# Patient Record
Sex: Female | Born: 1980 | Race: White | Hispanic: No | Marital: Married | Smoking: Never smoker
Health system: Southern US, Community
[De-identification: ages and names within clinical notes are randomized; demographics above are authoritative.]

## PROBLEM LIST (undated history)

## (undated) DIAGNOSIS — J45909 Unspecified asthma, uncomplicated: Secondary | ICD-10-CM

## (undated) DIAGNOSIS — T7840XA Allergy, unspecified, initial encounter: Secondary | ICD-10-CM

## (undated) DIAGNOSIS — D649 Anemia, unspecified: Secondary | ICD-10-CM

## (undated) HISTORY — DX: Allergy, unspecified, initial encounter: T78.40XA

## (undated) HISTORY — DX: Anemia, unspecified: D64.9

## (undated) HISTORY — DX: Unspecified asthma, uncomplicated: J45.909

---
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
## 2000-03-09 ENCOUNTER — Other Ambulatory Visit: Admission: RE | Admit: 2000-03-09 | Discharge: 2000-03-09 | Payer: Self-pay | Admitting: Gynecology

## 2001-05-02 ENCOUNTER — Other Ambulatory Visit: Admission: RE | Admit: 2001-05-02 | Discharge: 2001-05-02 | Payer: Self-pay | Admitting: Gynecology

## 2003-12-23 ENCOUNTER — Other Ambulatory Visit: Admission: RE | Admit: 2003-12-23 | Discharge: 2003-12-23 | Payer: Self-pay | Admitting: Obstetrics and Gynecology

## 2005-03-20 ENCOUNTER — Other Ambulatory Visit: Admission: RE | Admit: 2005-03-20 | Discharge: 2005-03-20 | Payer: Self-pay | Admitting: Obstetrics and Gynecology

## 2012-05-10 ENCOUNTER — Ambulatory Visit (INDEPENDENT_AMBULATORY_CARE_PROVIDER_SITE_OTHER): Payer: BC Managed Care – PPO | Admitting: Physician Assistant

## 2012-05-10 VITALS — BP 123/83 | HR 92 | Temp 98.6°F | Resp 16 | Ht 66.5 in | Wt 136.2 lb

## 2012-05-10 DIAGNOSIS — J302 Other seasonal allergic rhinitis: Secondary | ICD-10-CM | POA: Insufficient documentation

## 2012-05-10 DIAGNOSIS — J309 Allergic rhinitis, unspecified: Secondary | ICD-10-CM

## 2012-05-10 MED ORDER — FLUTICASONE PROPIONATE 50 MCG/ACT NA SUSP: 2.0000 | Freq: Every day | NASAL | Status: DC

## 2012-05-10 NOTE — Progress Notes (Signed)
  Subjective:    Patient ID: KIMBERL VIG, female    DOB: 06/05/1981, 31 y.o.   MRN: [id number]  HPI 31 yr old CF presents with 2-3 day history of drainage in throat, slight sore throat, R ear pressure, and her voice is scratchy.  She felt achy last night.  She just returned from a beach trip and is travelling to [place] tomorrow where she is supposed to be singing on Saturday and Sunday. No f/c. She has a history of year-round allergies.  Nasal sprays haven't worked great in the past for her.   Review of Systems  All other systems reviewed and are negative.      Objective:   Physical Exam  Nursing note and vitals reviewed. Constitutional: She is oriented to person, place, and time. She appears well-developed and well-nourished.  HENT:  Head: Normocephalic and atraumatic.  Right Ear: External ear normal.  Left Ear: External ear normal.  Mouth/Throat: Oropharynx is clear and moist. No oropharyngeal exudate (throat w/ PND.  no swelling of tonsils or uvula).       R TM bulging with fluid.  Turbinates enlarged and more erythematous than allergic-looking.  Neck: Normal range of motion. Neck supple.  Cardiovascular: Normal rate, regular rhythm and normal heart sounds.   Pulmonary/Chest: Effort normal and breath sounds normal.  Lymphadenopathy:    She has no cervical adenopathy.  Neurological: She is alert and oriented to person, place, and time.  Skin: Skin is warm and dry.  Psychiatric: She has a normal mood and affect. Her behavior is normal.   Voice is scratchy, but not hoarse.    Assessment & Plan:  URI affecting voice-patient inquired about a steroid shot, but I really don't think that will help because there isn't significant swelling; it appears more related to the mucosa being raw from drainage. Salt water gargles. Tea with lemon and honey.  REST voice!! Add mucinex D.

## 2012-07-25 ENCOUNTER — Ambulatory Visit (INDEPENDENT_AMBULATORY_CARE_PROVIDER_SITE_OTHER): Payer: BC Managed Care – PPO | Admitting: Family Medicine

## 2012-07-25 ENCOUNTER — Ambulatory Visit: Payer: BC Managed Care – PPO

## 2012-07-25 VITALS — BP 127/86 | HR 92 | Temp 98.7°F | Resp 16 | Ht 66.5 in | Wt 138.0 lb

## 2012-07-25 DIAGNOSIS — R1013 Epigastric pain: Secondary | ICD-10-CM

## 2012-07-25 LAB — POCT URINALYSIS DIPSTICK
Bilirubin, UA: NEGATIVE
Glucose, UA: NEGATIVE
Ketones, UA: NEGATIVE
Nitrite, UA: NEGATIVE
Protein, UA: NEGATIVE
Spec Grav, UA: 1.02
Urobilinogen, UA: 0.2
pH, UA: 6

## 2012-07-25 LAB — POCT CBC
Granulocyte percent: 61.3 %G (ref 37–80)
HCT, POC: 44.1 % (ref 37.7–47.9)
Hemoglobin: 13.7 g/dL (ref 12.2–16.2)
Lymph, poc: 3.3 (ref 0.6–3.4)
MCH, POC: 30.1 pg (ref 27–31.2)
MCHC: 31.1 g/dL — AB (ref 31.8–35.4)
MCV: 96.9 fL (ref 80–97)
MID (cbc): 0.5 (ref 0–0.9)
MPV: 9.4 fL (ref 0–99.8)
POC Granulocyte: 6 (ref 2–6.9)
POC LYMPH PERCENT: 33.8 %L (ref 10–50)
POC MID %: 4.9 %M (ref 0–12)
Platelet Count, POC: 382 10*3/uL (ref 142–424)
RBC: 4.55 M/uL (ref 4.04–5.48)
RDW, POC: 12.5 %
WBC: 9.8 10*3/uL (ref 4.6–10.2)

## 2012-07-25 MED ORDER — DICYCLOMINE HCL 10 MG PO CAPS: 10.0000 mg | ORAL_CAPSULE | Freq: Three times a day (TID) | ORAL | Status: DC

## 2012-07-25 MED ORDER — ESOMEPRAZOLE MAGNESIUM 40 MG PO CPDR: 40.0000 mg | DELAYED_RELEASE_CAPSULE | Freq: Every day | ORAL | Status: DC

## 2012-07-25 NOTE — Patient Instructions (Signed)
Clear liquids tonight.  Eggs and soft foods tomorrow.  Return if symptoms persist

## 2012-07-25 NOTE — Progress Notes (Signed)
31 yo woman with 3 weeks of epigastric abdominal discomfort until today when she developed more intense, sharp cramps.  She had BM x 2 and pain lessened.  The pain began right  After lunch. LMP:  October 12-15 Some nausea today. On loestrin  Objective:  NAD Thin white woman  HEENT:  No jaundice Chest:  Clear Heart: reg, no  Murmur Abdomen:  No significant tenderness, no HSM, no masses, active BS Extrem: normal movement, inspection  [facility] reading (PRIMARY) by  Dr. Milus Glazier.  KUB-normal  Results for orders placed in visit on 07/25/12  POCT CBC      Component Value Range   WBC 9.8  4.6 - 10.2 K/uL   Lymph, poc 3.3  0.6 - 3.4   POC LYMPH PERCENT 33.8  10 - 50 %L   MID (cbc) 0.5  0 - 0.9   POC MID % 4.9  0 - 12 %M   POC Granulocyte 6.0  2 - 6.9   Granulocyte percent 61.3  37 - 80 %G   RBC 4.55  4.04 - 5.48 M/uL   Hemoglobin 13.7  12.2 - 16.2 g/dL   HCT, POC 16.1  09.6 - 47.9 %   MCV 96.9  80 - 97 fL   MCH, POC 30.1  27 - 31.2 pg   MCHC 31.1 (*) 31.8 - 35.4 g/dL   RDW, POC 04.5     Platelet Count, POC 382  142 - 424 K/uL   MPV 9.4  0 - 99.8 fL  POCT URINALYSIS DIPSTICK      Component Value Range   Color, UA yellow     Clarity, UA clear     Glucose, UA neg     Bilirubin, UA neg     Ketones, UA neg     Spec Grav, UA 1.020     Blood, UA mod     pH, UA 6.0     Protein, UA neg     Urobilinogen, UA 0.2     Nitrite, UA neg     Leukocytes, UA Trace     Assessment:  Acute crampy abdominal pain, likely gastritis  Plan:

## 2013-01-05 ENCOUNTER — Ambulatory Visit (INDEPENDENT_AMBULATORY_CARE_PROVIDER_SITE_OTHER): Payer: BC Managed Care – PPO | Admitting: Family Medicine

## 2013-01-05 VITALS — BP 133/77 | HR 99 | Temp 98.1°F | Resp 18 | Ht 66.5 in | Wt 138.6 lb

## 2013-01-05 DIAGNOSIS — J029 Acute pharyngitis, unspecified: Secondary | ICD-10-CM

## 2013-01-05 DIAGNOSIS — R05 Cough: Secondary | ICD-10-CM

## 2013-01-05 DIAGNOSIS — J0100 Acute maxillary sinusitis, unspecified: Secondary | ICD-10-CM

## 2013-01-05 DIAGNOSIS — R059 Cough, unspecified: Secondary | ICD-10-CM

## 2013-01-05 MED ORDER — FLUTICASONE PROPIONATE 50 MCG/ACT NA SUSP: 2.0000 | Freq: Every day | NASAL | Status: AC

## 2013-01-05 MED ORDER — CEFDINIR 300 MG PO CAPS: 600.0000 mg | ORAL_CAPSULE | Freq: Every day | ORAL | Status: DC

## 2013-01-05 MED ORDER — HYDROCOD POLST-CHLORPHEN POLST 10-8 MG/5ML PO LQCR: 5.0000 mL | Freq: Two times a day (BID) | ORAL | Status: DC | PRN

## 2013-01-05 NOTE — Progress Notes (Signed)
[address]   [place], [place]  [address]   [phone]  Subjective:    Patient ID: Haley Curry, female    DOB: 03-28-1981, 32 y.o.   MRN: [id number]  HPI This 32 y.o. female presents for evaluation of sinus congestion.  Husband recently ill with viral URI; onset of severe sore throat.  The following day, developed severe rhinorrhea, sinus pressure, mucous thickening during first week. Week two, suffered with cough.  Feeling better with persistent cough, thick drainage.  Felt great two days ago.  Awoke this morning, woke up with sore throat.  Husband experienced the same thing with recurrent sore throat. No fever recently but initially.  Severe headache four days ago but did not take Sudafed that day; had sinus pressure.  ST persistent; unable to sleep last night due to sore throat; pain with swallowing.  R ear pain started last night.  No longer has rhinorrhea but having PND; yellow thick but now clear.  Coughing is some better; still coughing.  No n/v/d.  Mucinex DM, Benadryl PRN, Zyrtec. Stopped Flonase.  Sinus pressure improved today.  Works in [employer]; husband works at [employer].  No tobacco.  Worried about whooping cough; due for Tetanus vaccine.  Cough is dry; no sputum production; no SOB.   Review of Systems  Constitutional: Negative for fever, chills, diaphoresis and fatigue.  HENT: Positive for ear pain, congestion, sore throat, rhinorrhea, trouble swallowing, voice change, postnasal drip and sinus pressure.   Respiratory: Positive for cough. Negative for shortness of breath, wheezing and stridor.   Gastrointestinal: Negative for nausea, vomiting and diarrhea.  Neurological: Positive for headaches.        Past Medical History  Diagnosis Date  . Allergy   . Anemia     History reviewed. No pertinent past surgical history.  Prior to Admission medications   Medication Sig Start Date End Date Taking? Authorizing Provider  cetirizine (ZYRTEC) 10 MG tablet Take 10 mg by  mouth daily.   Yes Historical Provider, MD  fluticasone (FLONASE) 50 MCG/ACT nasal spray Place 2 sprays into the nose daily. 05/10/12 05/10/13 Yes Marzella Schlein McClung, PA-C  mineral oil external liquid by Does not apply route.   Yes Historical Provider, MD  norethindrone-ethinyl estradiol (MICROGESTIN,JUNEL,LOESTRIN) 1-20 MG-MCG tablet Take 1 tablet by mouth daily.   Yes Historical Provider, MD  dicyclomine (BENTYL) 10 MG capsule Take 1 capsule (10 mg total) by mouth 4 (four) times daily -  before meals and at bedtime. 07/25/12   Elvina Sidle, MD  esomeprazole (NEXIUM) 40 MG capsule Take 1 capsule (40 mg total) by mouth daily. 07/25/12   Elvina Sidle, MD  Norethindrone Acetate-Ethinyl Estrad-FE (LOESTRIN 24 FE) 1-20 MG-MCG(24) tablet Take 1 tablet by mouth daily.    Historical Provider, MD    No Known Allergies  History   Social History  . Marital Status: Married    Spouse Name: N/A    Number of Children: N/A  . Years of Education: N/A   Occupational History  . Not on file.   Social History Main Topics  . Smoking status: Never Smoker   . Smokeless tobacco: Never Used  . Alcohol Use: Yes     Comment: social  . Drug Use: No  . Sexually Active: Not on file   Other Topics Concern  . Not on file   Social History Narrative  . No narrative on file    No family history on file.  Objective:   Physical Exam  Nursing note and vitals reviewed. Constitutional: She is oriented to person, place, and time. She appears well-developed and well-nourished. No distress.  HENT:  Head: Normocephalic and atraumatic.  Right Ear: External ear normal.  Left Ear: External ear normal.  Nose: Mucosal edema and rhinorrhea present.  Mouth/Throat: Mucous membranes are normal. Posterior oropharyngeal erythema present. No oropharyngeal exudate, posterior oropharyngeal edema or tonsillar abscesses.  Neck: Normal range of motion. Neck supple. No thyromegaly present.  Cardiovascular: Normal rate, regular  rhythm and normal heart sounds.   No murmur heard. Pulmonary/Chest: Effort normal and breath sounds normal. No respiratory distress. She has no wheezes. She has no rales.  Lymphadenopathy:    She has no cervical adenopathy.  Neurological: She is alert and oriented to person, place, and time.  Skin: Skin is warm and dry. No rash noted. She is not diaphoretic.  Psychiatric: She has a normal mood and affect. Her behavior is normal.    Results for orders placed in visit on 01/05/13  POCT RAPID STREP A (OFFICE)      Result Value Range   Rapid Strep A Screen Negative  Negative       Assessment & Plan:  Acute pharyngitis - Plan: POCT rapid strep A, Culture, Group A Strep  Cough - Plan: POCT rapid strep A, Culture, Group A Strep, chlorpheniramine-HYDROcodone (TUSSIONEX PENNKINETIC ER) 10-8 MG/5ML LQCR  Sinusitis, acute maxillary - Plan: cefdinir (OMNICEF) 300 MG capsule, fluticasone (FLONASE) 50 MCG/ACT nasal spray    1.  Acute Pharyngitis:  New.  Rapid strep negative; send throat culture. Treat supportively with rest, fluids, salt water gargles, Ibuprofen or Tylenol. 2.  Acute Sinusitis Maxillary:  New.  Rx for Omnicef.  Continue Sudafed, Zyrtec, Mucinex DM.  Restart Flonase. 3.  Cough:  New. Secondary to PND; rx for Tussionex to use PRN.  Meds ordered this encounter  Medications  . mineral oil external liquid    Sig: by Does not apply route.  . norethindrone-ethinyl estradiol (MICROGESTIN,JUNEL,LOESTRIN) 1-20 MG-MCG tablet    Sig: Take 1 tablet by mouth daily.  . cefdinir (OMNICEF) 300 MG capsule    Sig: Take 2 capsules (600 mg total) by mouth daily.    Dispense:  20 capsule    Refill:  0  . chlorpheniramine-HYDROcodone (TUSSIONEX PENNKINETIC ER) 10-8 MG/5ML LQCR    Sig: Take 5 mLs by mouth every 12 (twelve) hours as needed.    Dispense:  240 mL    Refill:  0  . fluticasone (FLONASE) 50 MCG/ACT nasal spray    Sig: Place 2 sprays into the nose daily.    Dispense:  16 g     Refill:  11

## 2013-01-05 NOTE — Patient Instructions (Addendum)
Acute pharyngitis - Plan: POCT rapid strep A, Culture, Group A Strep  Cough - Plan: POCT rapid strep A, Culture, Group A Strep, chlorpheniramine-HYDROcodone (TUSSIONEX PENNKINETIC ER) 10-8 MG/5ML LQCR  Sinusitis, acute maxillary - Plan: cefdinir (OMNICEF) 300 MG capsule, fluticasone (FLONASE) 50 MCG/ACT nasal spray

## 2013-01-07 LAB — CULTURE, GROUP A STREP

## 2013-01-15 ENCOUNTER — Telehealth: Payer: Self-pay

## 2013-01-15 NOTE — Telephone Encounter (Signed)
Spoke to her, she states she is having GI upset. She states she discontinued the medication, then tried taking it again. She is taking with food. She is advised with antibiotics, it is best to finish the entire course. She has 3 days left. She states she can not tolerate it, please advise. She states her illness has resolved.

## 2013-01-15 NOTE — Telephone Encounter (Signed)
PATIENT STATES SHE IS HAVING AN ALLERGIC REACTION TO OMNICEF. SHE USES [facility] ON [address]. [phone].

## 2013-01-16 NOTE — Telephone Encounter (Signed)
Called her to advise. She is better today.

## 2013-01-16 NOTE — Telephone Encounter (Signed)
OK to stop antibiotic. Follow up if symptoms persist.

## 2013-01-25 ENCOUNTER — Ambulatory Visit (INDEPENDENT_AMBULATORY_CARE_PROVIDER_SITE_OTHER): Payer: BC Managed Care – PPO | Admitting: Emergency Medicine

## 2013-01-25 VITALS — BP 122/78 | HR 88 | Temp 99.8°F | Resp 18 | Wt 143.0 lb

## 2013-01-25 DIAGNOSIS — J309 Allergic rhinitis, unspecified: Secondary | ICD-10-CM

## 2013-01-25 DIAGNOSIS — H9209 Otalgia, unspecified ear: Secondary | ICD-10-CM

## 2013-01-25 DIAGNOSIS — R509 Fever, unspecified: Secondary | ICD-10-CM

## 2013-01-25 DIAGNOSIS — J029 Acute pharyngitis, unspecified: Secondary | ICD-10-CM

## 2013-01-25 DIAGNOSIS — H9202 Otalgia, left ear: Secondary | ICD-10-CM

## 2013-01-25 LAB — POCT CBC
Granulocyte percent: 72.5 %G (ref 37–80)
HCT, POC: 43.6 % (ref 37.7–47.9)
Lymph, poc: 2.2 (ref 0.6–3.4)
MCV: 95.6 fL (ref 80–97)
MPV: 9.3 fL (ref 0–99.8)
POC LYMPH PERCENT: 21.4 %L (ref 10–50)
Platelet Count, POC: 301 10*3/uL (ref 142–424)
RDW, POC: 13.1 %
WBC: 10.5 10*3/uL — AB (ref 4.6–10.2)

## 2013-01-25 LAB — POCT RAPID STREP A (OFFICE): Rapid Strep A Screen: NEGATIVE

## 2013-01-25 MED ORDER — PREDNISONE 10 MG PO TABS: ORAL_TABLET | ORAL | Status: DC

## 2013-01-25 MED ORDER — AZITHROMYCIN 250 MG PO TABS: ORAL_TABLET | ORAL | Status: DC

## 2013-01-25 NOTE — Progress Notes (Signed)
  Subjective:    Patient ID: Haley Curry, female    DOB: December 10, 1980, 32 y.o.   MRN: [id number]  HPI 32 year old female presents with left otalgia and sore throat since Thursday night. She states it has lots of pressure with popping and "squealing" sounds. Has had fever and chills x last night. Denies night sweats. Had strep in the past years ago. LMP: 1 month ago.  Review of Systems     Objective:   Physical Exam patient is alert cooperative does not appear ill. The TMs are both normal. A small redness on the left side of the throat. There is no adenopathy. Chest is clear to auscultation and percussion.  Results for orders placed in visit on 01/25/13  POCT RAPID STREP A (OFFICE)      Result Value Range   Rapid Strep A Screen Negative  Negative   Results for orders placed in visit on 01/25/13  POCT RAPID STREP A (OFFICE)      Result Value Range   Rapid Strep A Screen Negative  Negative  POCT CBC      Result Value Range   WBC 10.5 (*) 4.6 - 10.2 K/uL   Lymph, poc 2.2  0.6 - 3.4   POC LYMPH PERCENT 21.4  10 - 50 %L   MID (cbc) 0.6  0 - 0.9   POC MID % 6.1  0 - 12 %M   POC Granulocyte 7.6 (*) 2 - 6.9   Granulocyte percent 72.5  37 - 80 %G   RBC 4.56  4.04 - 5.48 M/uL   Hemoglobin 13.7  12.2 - 16.2 g/dL   HCT, POC 40.9  81.1 - 47.9 %   MCV 95.6  80 - 97 fL   MCH, POC 30.0  27 - 31.2 pg   MCHC 31.4 (*) 31.8 - 35.4 g/dL   RDW, POC 91.4     Platelet Count, POC 301  142 - 424 K/uL   MPV 9.3  0 - 99.8 fL        Assessment & Plan:  Strep culture was done. The white count is elevated. She's had trouble in the distant past with chronic sinus infections We  will treat with a Z-Pak and prednisone  Taper. she was advised about birth control backup .

## 2013-01-27 LAB — CULTURE, GROUP A STREP: Organism ID, Bacteria: NORMAL

## 2013-02-15 ENCOUNTER — Ambulatory Visit (INDEPENDENT_AMBULATORY_CARE_PROVIDER_SITE_OTHER): Payer: BC Managed Care – PPO | Admitting: Family Medicine

## 2013-02-15 VITALS — BP 88/70 | HR 77 | Temp 98.6°F | Resp 18 | Wt 142.0 lb

## 2013-02-15 DIAGNOSIS — K529 Noninfective gastroenteritis and colitis, unspecified: Secondary | ICD-10-CM

## 2013-02-15 DIAGNOSIS — K5289 Other specified noninfective gastroenteritis and colitis: Secondary | ICD-10-CM

## 2013-02-15 MED ORDER — METRONIDAZOLE 250 MG PO TABS: 250.0000 mg | ORAL_TABLET | Freq: Three times a day (TID) | ORAL | Status: DC

## 2013-02-15 NOTE — Progress Notes (Signed)
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  She didn't sleep that night and subsequently develops the abdominal discomfort and bloating with food consumption.  Able to drink water okay.  Nauseated, but no vomiting, or diarrhea.  No significant belching.  Objective:  NAD Abdomen:  Mildly distended.  Hyperactive BS.  Assessment:  Gastroenteritis  Plan:  Probiotics Flagyl  Signed, Elvina Sidle, MD

## 2013-04-23 ENCOUNTER — Ambulatory Visit (INDEPENDENT_AMBULATORY_CARE_PROVIDER_SITE_OTHER): Payer: BC Managed Care – PPO | Admitting: Family Medicine

## 2013-04-23 VITALS — BP 120/70 | HR 64 | Temp 98.2°F | Resp 16 | Ht 67.0 in | Wt 145.0 lb

## 2013-04-23 DIAGNOSIS — J45990 Exercise induced bronchospasm: Secondary | ICD-10-CM

## 2013-04-23 DIAGNOSIS — R591 Generalized enlarged lymph nodes: Secondary | ICD-10-CM

## 2013-04-23 DIAGNOSIS — J3489 Other specified disorders of nose and nasal sinuses: Secondary | ICD-10-CM

## 2013-04-23 DIAGNOSIS — R599 Enlarged lymph nodes, unspecified: Secondary | ICD-10-CM

## 2013-04-23 MED ORDER — MUPIROCIN 2 % EX OINT: TOPICAL_OINTMENT | Freq: Three times a day (TID) | CUTANEOUS | Status: DC

## 2013-04-23 MED ORDER — DOXYCYCLINE HYCLATE 100 MG PO TABS: 100.0000 mg | ORAL_TABLET | Freq: Two times a day (BID) | ORAL | Status: DC

## 2013-04-23 MED ORDER — ALBUTEROL SULFATE HFA 108 (90 BASE) MCG/ACT IN AERS: 2.0000 | INHALATION_SPRAY | Freq: Four times a day (QID) | RESPIRATORY_TRACT | Status: DC | PRN

## 2013-04-23 NOTE — Progress Notes (Signed)
32 year old married woman who works at [employer]. She comes in with several problems. First of all she's noticed a sore bump behind her right ear which has been associated with some scalp irritation for the past month. The exam table the last several days.  Patient also has some shortness of breath and tightness in her chest when she does aerobic exercise. He's to start her new program. He's been using her husband's albuterol inhaler which has helped her.  Finally patient has noted over several years and intermittent crusting of her left nostril on the outside which is associated with some bleeding. She's been on allergy shots in the past and has an ENT doctor who she can she can consult.  Objective: No acute distress Examination of the right ear is normal. The auricle is normal. He does have some excoriation of the scalp on the right side and she does have a slightly swollen and tender right postauricular lymph node.  Examination the nose reveals some bloody crusting debris on the lateral nasal passage near the anterior aspect.  Lungs are clear  Assessment:Exercise-induced asthma - Plan: albuterol (PROVENTIL HFA;VENTOLIN HFA) 108 (90 BASE) MCG/ACT inhaler  Lymphadenopathy - Plan: doxycycline (VIBRA-TABS) 100 MG tablet, DISCONTINUED: doxycycline (VIBRA-TABS) 100 MG tablet  Nasal sore - Plan: mupirocin ointment (BACTROBAN) 2 %  Signed, Elvina Sidle, MD

## 2013-09-24 ENCOUNTER — Other Ambulatory Visit: Payer: Self-pay | Admitting: Family Medicine

## 2013-12-14 ENCOUNTER — Ambulatory Visit: Payer: BC Managed Care – PPO | Admitting: Family Medicine

## 2013-12-14 ENCOUNTER — Ambulatory Visit: Payer: BC Managed Care – PPO

## 2013-12-14 ENCOUNTER — Other Ambulatory Visit: Payer: Self-pay | Admitting: Family Medicine

## 2013-12-14 VITALS — BP 118/70 | HR 116 | Temp 98.8°F | Resp 20 | Ht 68.0 in | Wt 147.0 lb

## 2013-12-14 DIAGNOSIS — J029 Acute pharyngitis, unspecified: Secondary | ICD-10-CM

## 2013-12-14 DIAGNOSIS — M25532 Pain in left wrist: Secondary | ICD-10-CM

## 2013-12-14 DIAGNOSIS — M25531 Pain in right wrist: Secondary | ICD-10-CM

## 2013-12-14 DIAGNOSIS — M654 Radial styloid tenosynovitis [de Quervain]: Secondary | ICD-10-CM

## 2013-12-14 DIAGNOSIS — M25539 Pain in unspecified wrist: Secondary | ICD-10-CM

## 2013-12-14 DIAGNOSIS — Z3200 Encounter for pregnancy test, result unknown: Secondary | ICD-10-CM

## 2013-12-14 LAB — POCT URINE PREGNANCY: Preg Test, Ur: NEGATIVE

## 2013-12-14 LAB — POCT RAPID STREP A (OFFICE): Rapid Strep A Screen: NEGATIVE

## 2013-12-14 NOTE — Patient Instructions (Signed)
De Quervain's Disease De Quervain's disease is a condition often seen in racquet sports where there is a soreness (inflammation) in the cord like structures (tendons) which attach muscle to bone on the thumb side of the wrist. There may be a tightening of the tissuesaround the tendons. This condition is often helped by giving up or modifying the activity which caused it. When conservative treatment does not help, surgery may be required. Conservative treatment could include changes in the activity which brought about the problem or made it worse. Anti-inflammatory medications and injections may be used to help decrease the inflammation and help with pain control. Your caregiver will help you determine which is best for you. DIAGNOSIS  Often the diagnosis (learning what is wrong) can be made by examination. Sometimes x-rays are required. HOME CARE INSTRUCTIONS   Apply ice to the sore area for 15-20 minutes, 03-04 times per day while awake. Put the ice in a plastic bag and place a towel between the bag of ice and your skin. This is especially helpful if it can be done after all activities involving the sore wrist.  Temporary splinting may help.  Only take over-the-counter or prescription medicines for pain, discomfort or fever as directed by your caregiver. SEEK MEDICAL CARE IF:   Pain relief is not obtained with medications, or if you have increasing pain and seem to be getting worse rather than better. MAKE SURE YOU:   Understand these instructions.  Will watch your condition.  Will get help right away if you are not doing well or get worse. Document Released: 06/20/2001 Document Revised: 12/18/2011 Document Reviewed: 09/25/2005 [employer] Patient Information 2014 [employer].  

## 2013-12-14 NOTE — Progress Notes (Addendum)
33 year old married woman who works at [employer]. She does a lot of traveling with a light peach week the different parts the country.  Patient has 2 problems today: Left wrist pain which she developed after lifting a mattress 6 weeks ago and is located over the radial aspect of the wrist joint. It's worse when she pushes off with her hand or if she taps over the extensor tendon of the thumb and the wrist joint area.  Patient also has a sore throat for 2 days which is associated with white patches on her right tonsillar area. She's reluctant take antibiotics with sneezing and some a temp last year.  She had some myalgias initially, but has had no known fever.  Objective: No acute distress HEENT: Unremarkable except for the moderate swelling and redness of the right tonsillar pillar Neck: Supple no adenopathy Examination of the right wrist reveals tenderness in the joint line along the radial aspect but full range of motion is present. Is no bony abnormality. She has a positive Finkelstein's test Right wrist: Injected with .5 cc of Depo-Medrol and 1 cc of Marcaine without complication. Patient had a significant relief from her pain [facility] reading (PRIMARY) by  Dr. Milus GlazierLauenstein: normal right wrist Results for orders placed in visit on 12/14/13  POCT RAPID STREP A (OFFICE)      Result Value Ref Range   Rapid Strep A Screen Negative  Negative  POCT URINE PREGNANCY      Result Value Ref Range   Preg Test, Ur Negative       Assessment:  Dequervain's synovitis, pharyngitis . Sore throat - Plan: POCT rapid strep A  Possible pregnancy - Plan: POCT urine pregnancy  De Quervain's disease (radial styloid tenosynovitis)  Right wrist pain  Signed, Elvina SidleKurt Leyland Kenna, MD    Signed, Elvina SidleKurt Nylen Creque, MD

## 2014-07-19 ENCOUNTER — Ambulatory Visit (INDEPENDENT_AMBULATORY_CARE_PROVIDER_SITE_OTHER): Payer: BC Managed Care – PPO | Admitting: Internal Medicine

## 2014-07-19 VITALS — BP 120/78 | HR 86 | Temp 98.3°F | Resp 18 | Ht 66.75 in | Wt 146.8 lb

## 2014-07-19 DIAGNOSIS — J0101 Acute recurrent maxillary sinusitis: Secondary | ICD-10-CM

## 2014-07-19 MED ORDER — AMOXICILLIN 500 MG PO CAPS: 1000.0000 mg | ORAL_CAPSULE | Freq: Two times a day (BID) | ORAL | Status: AC

## 2014-07-19 NOTE — Progress Notes (Signed)
   Subjective:    Patient ID: Haley Curry, female    DOB: 03/01/1981, 33 y.o.   MRN: [id number]  HPI complaining of cold symptoms with runny nose and cough for 7 days suddenly worsening yesterday with increased sinus pressure and purulent discharge. Has a history of recurrent sinus infections most notably fall of 2014 which require prolonged antibiotic therapy by ENT before clearing. Has a responsive to nasal steroids that causes chronic sore throat so these are avoided. Is on Singulair and antihistamines--year-round allergies. Thinks she had fever last night Cough nonproductive    Review of Systems    noncontributory Objective:   Physical Exam BP 120/78  Pulse 86  Temp(Src) 98.3 F (36.8 C) (Oral)  Resp 18  Ht 5' 6.75" (1.695 m)  Wt 146 lb 12.8 oz (66.588 kg)  BMI 23.18 kg/m2  SpO2 100% TMs clear Conjunctiva slightly injected Nares with purulent mucus Pressure pain with bending over Throat clear No nodes Chest clear       Assessment & Plan:  Acute recurrent maxillary sinusitis  Meds ordered this encounter  Medications  . amoxicillin (AMOXIL) 500 MG capsule    Sig: Take 2 capsules (1,000 mg total) by mouth 2 (two) times daily.    Dispense:  40 capsule    Refill:  0  Afrin before flying Sudafed in am

## 2014-10-01 ENCOUNTER — Ambulatory Visit (INDEPENDENT_AMBULATORY_CARE_PROVIDER_SITE_OTHER): Payer: BC Managed Care – PPO | Admitting: Family Medicine

## 2014-10-01 VITALS — BP 124/80 | HR 90 | Temp 98.4°F | Resp 17 | Ht 68.0 in | Wt 144.0 lb

## 2014-10-01 DIAGNOSIS — J3489 Other specified disorders of nose and nasal sinuses: Secondary | ICD-10-CM

## 2014-10-01 DIAGNOSIS — J029 Acute pharyngitis, unspecified: Secondary | ICD-10-CM

## 2014-10-01 DIAGNOSIS — R05 Cough: Secondary | ICD-10-CM

## 2014-10-01 DIAGNOSIS — J0100 Acute maxillary sinusitis, unspecified: Secondary | ICD-10-CM

## 2014-10-01 DIAGNOSIS — R059 Cough, unspecified: Secondary | ICD-10-CM

## 2014-10-01 DIAGNOSIS — R0982 Postnasal drip: Secondary | ICD-10-CM

## 2014-10-01 LAB — POCT RAPID STREP A (OFFICE): Rapid Strep A Screen: NEGATIVE

## 2014-10-01 MED ORDER — METHYLPREDNISOLONE (PAK) 4 MG PO TABS: ORAL_TABLET | ORAL | Status: DC

## 2014-10-01 MED ORDER — AZITHROMYCIN 250 MG PO TABS: ORAL_TABLET | ORAL | Status: DC

## 2014-10-01 MED ORDER — BENZONATATE 100 MG PO CAPS: 200.0000 mg | ORAL_CAPSULE | Freq: Two times a day (BID) | ORAL | Status: DC | PRN

## 2014-10-01 MED ORDER — IPRATROPIUM BROMIDE 0.03 % NA SOLN: 2.0000 | Freq: Two times a day (BID) | NASAL | Status: DC

## 2014-10-01 NOTE — Progress Notes (Signed)
 Chief Complaint:  Chief Complaint  Patient presents with  . Sinusitis  . Sore Throat  . Nasal Congestion    HPI: Haley Curry is a 33 y.o. female who is here for sore throat and post nasal drip and sinus congestion and sore throat for last 1 week with worsening sxs in last 2 days.  She has asthma and allergies. She has a hxof allergies and asthma. No wheezing, no SOB or CP. Has tried toc meds without relief. Has not had asthma in many years.   Past Medical History  Diagnosis Date  . Allergy   . Anemia   . Asthma    No past surgical history on file. History   Social History  . Marital Status: Married    Spouse Name: N/A    Number of Children: N/A  . Years of Education: N/A   Social History Main Topics  . Smoking status: Never Smoker   . Smokeless tobacco: Never Used  . Alcohol Use: Yes     Comment: social  . Drug Use: No  . Sexual Activity: Yes    Birth Control/ Protection: Pill   Other Topics Concern  . Not on file   Social History Narrative  . No narrative on file   Family History  Problem Relation Age of Onset  . Cancer Mother   . Diabetes Father   . Asthma Brother    Allergies  Allergen Reactions  . Omnicef [Cefdinir]    Prior to Admission medications   Medication Sig Start Date End Date Taking? Authorizing Provider  albuterol (PROVENTIL HFA;VENTOLIN HFA) 108 (90 BASE) MCG/ACT inhaler Inhale 2 puffs into the lungs every 6 (six) hours as needed for wheezing. 04/23/13  Yes Elvina SidleKurt Lauenstein, MD  cetirizine (ZYRTEC) 10 MG tablet Take 10 mg by mouth daily.   Yes Historical Provider, MD  diphenhydrAMINE (BENADRYL) 25 mg capsule Take 25 mg by mouth every 6 (six) hours as needed.   Yes Historical Provider, MD  ibuprofen (ADVIL,MOTRIN) 200 MG tablet Take 400 mg by mouth every 6 (six) hours as needed.   Yes Historical Provider, MD  montelukast (SINGULAIR) 10 MG tablet Take 10 mg by mouth at bedtime.   Yes Historical Provider, MD  Norethindrone  Acetate-Ethinyl Estrad-FE (LOESTRIN 24 FE) 1-20 MG-MCG(24) tablet Take 1 tablet by mouth daily.   Yes Historical Provider, MD  omeprazole (PRILOSEC) 40 MG capsule Take 40 mg by mouth daily.   Yes Historical Provider, MD  pseudoephedrine (SUDAFED) 120 MG 12 hr tablet Take 120 mg by mouth 2 (two) times daily.   Yes Historical Provider, MD  pseudoephedrine-guaifenesin (MUCINEX D) 60-600 MG per tablet Take 1 tablet by mouth every 12 (twelve) hours.   Yes Historical Provider, MD     ROS: The patient denies fevers, chills, night sweats, unintentional weight loss, chest pain, palpitations, wheezing, dyspnea on exertion, nausea, vomiting, abdominal pain, dysuria, hematuria, melena, numbness, weakness, or tingling.   All other systems have been reviewed and were otherwise negative with the exception of those mentioned in the HPI and as above.    PHYSICAL EXAM: Filed Vitals:   10/01/14 0826  BP: 124/80  Pulse: 90  Temp: 98.4 F (36.9 C)  Resp: 17   Filed Vitals:   10/01/14 0826  Height: 5\' 8"  (1.727 m)  Weight: 144 lb (65.318 kg)   Body mass index is 21.9 kg/(m^2).  SpO2 Readings from Last 3 Encounters:  10/01/14 100%  07/19/14 100%  12/14/13 99%  General: Alert, no acute distress HEENT:  Normocephalic, atraumatic, oropharynx patent. EOMI, PERRLA. + sinus tendesnss maxillar, TM nl Cardiovascular:  Regular rate and rhythm, no rubs murmurs or gallops.  No Carotid bruits, radial pulse intact. No pedal edema.  Respiratory: Clear to auscultation bilaterally.  No wheezes, rales, or rhonchi.  No cyanosis, no use of accessory musculature GI: No organomegaly, abdomen is soft and non-tender, positive bowel sounds.  No masses. Skin: No rashes. Neurologic: Facial musculature symmetric. Psychiatric: Patient is appropriate throughout our interaction. Lymphatic: No cervical lymphadenopathy Musculoskeletal: Gait intact.   LABS: Results for orders placed or performed in visit on 10/01/14  POCT  rapid strep A  Result Value Ref Range   Rapid Strep A Screen Negative Negative     EKG/XRAY:   [facility]   ASSESSMENT/PLAN: Encounter Diagnoses  Name Primary?  . Acute maxillary sinusitis, recurrence not specified Yes  . Cough   . Sore throat   . Rhinorrhea   . PND (post-nasal drip)    Rx z pack, tessalon perles c/w antihistamine and steroid nasal spray otc IF no improvement then may take medrol dose pack.  F/u prn   Gross sideeffects, risk and benefits, and alternatives of medications d/w patient. Patient is aware that all medications have potential sideeffects and we are unable to predict every sideeffect or drug-drug interaction that may occur.  ,  PHUONG, DO 10/06/2014 1:03 PM

## 2015-03-07 ENCOUNTER — Ambulatory Visit (INDEPENDENT_AMBULATORY_CARE_PROVIDER_SITE_OTHER): Payer: BLUE CROSS/BLUE SHIELD | Admitting: Family Medicine

## 2015-03-07 VITALS — BP 100/70 | HR 104 | Temp 98.4°F | Resp 16 | Ht 66.5 in | Wt 145.5 lb

## 2015-03-07 DIAGNOSIS — J069 Acute upper respiratory infection, unspecified: Secondary | ICD-10-CM

## 2015-03-07 DIAGNOSIS — J0191 Acute recurrent sinusitis, unspecified: Secondary | ICD-10-CM

## 2015-03-07 MED ORDER — AMOXICILLIN-POT CLAVULANATE 875-125 MG PO TABS: 1.0000 | ORAL_TABLET | Freq: Two times a day (BID) | ORAL | Status: DC

## 2015-03-07 NOTE — Patient Instructions (Signed)
Saline nasal spray atleast 4 times per day, over the counter mucinex, afrin only if needed and no more than three days in a row (would not combine afrin and sudafed). Drink plenty of fluids. If sinus pressure/discolored nasal discharge and pain not improving in next few days - can start Augmentin for possible sinus infection. Return to the clinic or go to the nearest emergency room if any of your symptoms worsen or new symptoms occur.  Upper Respiratory Infection, Adult An upper respiratory infection (URI) is also sometimes known as the common cold. The upper respiratory tract includes the nose, sinuses, throat, trachea, and bronchi. Bronchi are the airways leading to the lungs. Most people improve within 1 week, but symptoms can last up to 2 weeks. A residual cough may last even longer.  CAUSES Many different viruses can infect the tissues lining the upper respiratory tract. The tissues become irritated and inflamed and often become very moist. Mucus production is also common. A cold is contagious. You can easily spread the virus to others by oral contact. This includes kissing, sharing a glass, coughing, or sneezing. Touching your mouth or nose and then touching a surface, which is then touched by another person, can also spread the virus. SYMPTOMS  Symptoms typically develop 1 to 3 days after you come in contact with a cold virus. Symptoms vary from person to person. They may include:  Runny nose.  Sneezing.  Nasal congestion.  Sinus irritation.  Sore throat.  Loss of voice (laryngitis).  Cough.  Fatigue.  Muscle aches.  Loss of appetite.  Headache.  Low-grade fever. DIAGNOSIS  You might diagnose your own cold based on familiar symptoms, since most people get a cold 2 to 3 times a year. Your caregiver can confirm this based on your exam. Most importantly, your caregiver can check that your symptoms are not due to another disease such as strep throat, sinusitis, pneumonia, asthma,  or epiglottitis. Blood tests, throat tests, and X-rays are not necessary to diagnose a common cold, but they may sometimes be helpful in excluding other more serious diseases. Your caregiver will decide if any further tests are required. RISKS AND COMPLICATIONS  You may be at risk for a more severe case of the common cold if you smoke cigarettes, have chronic heart disease (such as heart failure) or lung disease (such as asthma), or if you have a weakened immune system. The very young and very old are also at risk for more serious infections. Bacterial sinusitis, middle ear infections, and bacterial pneumonia can complicate the common cold. The common cold can worsen asthma and chronic obstructive pulmonary disease (COPD). Sometimes, these complications can require emergency medical care and may be life-threatening. PREVENTION  The best way to protect against getting a cold is to practice good hygiene. Avoid oral or hand contact with people with cold symptoms. Wash your hands often if contact occurs. There is no clear evidence that vitamin C, vitamin E, echinacea, or exercise reduces the chance of developing a cold. However, it is always recommended to get plenty of rest and practice good nutrition. TREATMENT  Treatment is directed at relieving symptoms. There is no cure. Antibiotics are not effective, because the infection is caused by a virus, not by bacteria. Treatment may include:  Increased fluid intake. Sports drinks offer valuable electrolytes, sugars, and fluids.  Breathing heated mist or steam (vaporizer or shower).  Eating chicken soup or other clear broths, and maintaining good nutrition.  Getting plenty of rest.  Using  gargles or lozenges for comfort.  Controlling fevers with ibuprofen or acetaminophen as directed by your caregiver.  Increasing usage of your inhaler if you have asthma. Zinc gel and zinc lozenges, taken in the first 24 hours of the common cold, can shorten the  duration and lessen the severity of symptoms. Pain medicines may help with fever, muscle aches, and throat pain. A variety of non-prescription medicines are available to treat congestion and runny nose. Your caregiver can make recommendations and may suggest nasal or lung inhalers for other symptoms.  HOME CARE INSTRUCTIONS   Only take over-the-counter or prescription medicines for pain, discomfort, or fever as directed by your caregiver.  Use a warm mist humidifier or inhale steam from a shower to increase air moisture. This may keep secretions moist and make it easier to breathe.  Drink enough water and fluids to keep your urine clear or pale yellow.  Rest as needed.  Return to work when your temperature has returned to normal or as your caregiver advises. You may need to stay home longer to avoid infecting others. You can also use a face mask and careful hand washing to prevent spread of the virus. SEEK MEDICAL CARE IF:   After the first few days, you feel you are getting worse rather than better.  You need your caregiver's advice about medicines to control symptoms.  You develop chills, worsening shortness of breath, or brown or red sputum. These may be signs of pneumonia.  You develop yellow or brown nasal discharge or pain in the face, especially when you bend forward. These may be signs of sinusitis.  You develop a fever, swollen neck glands, pain with swallowing, or white areas in the back of your throat. These may be signs of strep throat. SEEK IMMEDIATE MEDICAL CARE IF:   You have a fever.  You develop severe or persistent headache, ear pain, sinus pain, or chest pain.  You develop wheezing, a prolonged cough, cough up blood, or have a change in your usual mucus (if you have chronic lung disease).  You develop sore muscles or a stiff neck. Document Released: 03/21/2001 Document Revised: 12/18/2011 Document Reviewed: 12/31/2013 [facility] Patient Information 2015 [place],  [place]. This information is not intended to replace advice given to you by your health care provider. Make sure you discuss any questions you have with your health care provider.   Sinusitis Sinusitis is redness, soreness, and inflammation of the paranasal sinuses. Paranasal sinuses are air pockets within the bones of your face (beneath the eyes, the middle of the forehead, or above the eyes). In healthy paranasal sinuses, mucus is able to drain out, and air is able to circulate through them by way of your nose. However, when your paranasal sinuses are inflamed, mucus and air can become trapped. This can allow bacteria and other germs to grow and cause infection. Sinusitis can develop quickly and last only a short time (acute) or continue over a long period (chronic). Sinusitis that lasts for more than 12 weeks is considered chronic.  CAUSES  Causes of sinusitis include:  Allergies.  Structural abnormalities, such as displacement of the cartilage that separates your nostrils (deviated septum), which can decrease the air flow through your nose and sinuses and affect sinus drainage.  Functional abnormalities, such as when the small hairs (cilia) that line your sinuses and help remove mucus do not work properly or are not present. SIGNS AND SYMPTOMS  Symptoms of acute and chronic sinusitis are the same. The primary  symptoms are pain and pressure around the affected sinuses. Other symptoms include:  Upper toothache.  Earache.  Headache.  Bad breath.  Decreased sense of smell and taste.  A cough, which worsens when you are lying flat.  Fatigue.  Fever.  Thick drainage from your nose, which often is green and may contain pus (purulent).  Swelling and warmth over the affected sinuses. DIAGNOSIS  Your health care provider will perform a physical exam. During the exam, your health care provider may:  Look in your nose for signs of abnormal growths in your nostrils (nasal polyps).  Tap  over the affected sinus to check for signs of infection.  View the inside of your sinuses (endoscopy) using an imaging device that has a light attached (endoscope). If your health care provider suspects that you have chronic sinusitis, one or more of the following tests may be recommended:  Allergy tests.  Nasal culture. A sample of mucus is taken from your nose, sent to a lab, and screened for bacteria.  Nasal cytology. A sample of mucus is taken from your nose and examined by your health care provider to determine if your sinusitis is related to an allergy. TREATMENT  Most cases of acute sinusitis are related to a viral infection and will resolve on their own within 10 days. Sometimes medicines are prescribed to help relieve symptoms (pain medicine, decongestants, nasal steroid sprays, or saline sprays).  However, for sinusitis related to a bacterial infection, your health care provider will prescribe antibiotic medicines. These are medicines that will help kill the bacteria causing the infection.  Rarely, sinusitis is caused by a fungal infection. In theses cases, your health care provider will prescribe antifungal medicine. For some cases of chronic sinusitis, surgery is needed. Generally, these are cases in which sinusitis recurs more than 3 times per year, despite other treatments. HOME CARE INSTRUCTIONS   Drink plenty of water. Water helps thin the mucus so your sinuses can drain more easily.  Use a humidifier.  Inhale steam 3 to 4 times a day (for example, sit in the bathroom with the shower running).  Apply a warm, moist washcloth to your face 3 to 4 times a day, or as directed by your health care provider.  Use saline nasal sprays to help moisten and clean your sinuses.  Take medicines only as directed by your health care provider.  If you were prescribed either an antibiotic or antifungal medicine, finish it all even if you start to feel better. SEEK IMMEDIATE MEDICAL CARE  IF:  You have increasing pain or severe headaches.  You have nausea, vomiting, or drowsiness.  You have swelling around your face.  You have vision problems.  You have a stiff neck.  You have difficulty breathing. MAKE SURE YOU:   Understand these instructions.  Will watch your condition.  Will get help right away if you are not doing well or get worse. Document Released: 09/25/2005 Document Revised: 02/09/2014 Document Reviewed: 10/10/2011 [facility] Patient Information 2015 [place]. This information is not intended to replace advice given to you by your health care provider. Make sure you discuss any questions you have with your health care provider.

## 2015-03-07 NOTE — Progress Notes (Signed)
Subjective:  This chart was scribed for Meredith StaggersJeffrey Navah Grondin, MD by Stann Oresung-Kai Tsai, Medical Scribe. This patient was seen in room 12 and the patient's care was started 4:07 PM.    Patient ID: Haley Curry, female    DOB: 10/07/1981, 34 y.o.   MRN: [id number]  HPI Haley Curry is a 34 y.o. female  Patient states that her throat was hurting with associated low grade fever and some nasal drainage that started 6 days ago. She thought it was allergies because she has them on and off. She also stated that the roof of her mouth was sore and had a little pus-like bump. She was previously drinking a lot water with lemon and lime. After she stopped drinking this, the bump and sore throat went away 5 days ago. But 3 nights ago, she had sore throat again with congestion.  It improved 2 days ago but this morning, she woke up feeling even worse again. She mentions having yellow discharge from the nose during the night. She used saline nasal spray with a squirt each side and took some SUDAFED.     Patient Active Problem List   Diagnosis Date Noted  . Seasonal allergies 05/10/2012   Past Medical History  Diagnosis Date  . Allergy   . Anemia   . Asthma    History reviewed. No pertinent past surgical history. Allergies  Allergen Reactions  . Omnicef [Cefdinir]    Prior to Admission medications   Medication Sig Start Date End Date Taking? Authorizing Provider  albuterol (PROVENTIL HFA;VENTOLIN HFA) 108 (90 BASE) MCG/ACT inhaler Inhale 2 puffs into the lungs every 6 (six) hours as needed for wheezing. 04/23/13  Yes Elvina SidleKurt Lauenstein, MD  cetirizine (ZYRTEC) 10 MG tablet Take 10 mg by mouth daily.   Yes Historical Provider, MD  diphenhydrAMINE (BENADRYL) 25 mg capsule Take 25 mg by mouth every 6 (six) hours as needed.   Yes Historical Provider, MD  ibuprofen (ADVIL,MOTRIN) 200 MG tablet Take 400 mg by mouth every 6 (six) hours as needed.   Yes Historical Provider, MD  mometasone (NASONEX) 50 MCG/ACT nasal  spray Place 2 sprays into the nose daily.   Yes Historical Provider, MD  montelukast (SINGULAIR) 10 MG tablet Take 10 mg by mouth at bedtime.   Yes Historical Provider, MD  Norethindrone Acetate-Ethinyl Estrad-FE (LOESTRIN 24 FE) 1-20 MG-MCG(24) tablet Take 1 tablet by mouth daily.   Yes Historical Provider, MD  pseudoephedrine (SUDAFED) 120 MG 12 hr tablet Take 120 mg by mouth 2 (two) times daily.   Yes Historical Provider, MD  pseudoephedrine-guaifenesin (MUCINEX D) 60-600 MG per tablet Take 1 tablet by mouth every 12 (twelve) hours.   Yes Historical Provider, MD   History   Social History  . Marital Status: Married    Spouse Name: N/A  . Number of Children: N/A  . Years of Education: N/A   Occupational History  . Not on file.   Social History Main Topics  . Smoking status: Never Smoker   . Smokeless tobacco: Never Used  . Alcohol Use: Yes     Comment: social  . Drug Use: No  . Sexual Activity: Yes    Birth Control/ Protection: Pill   Other Topics Concern  . Not on file   Social History Narrative        Review of Systems  Constitutional: Positive for fever.  HENT: Positive for congestion and sore throat.        Objective:   Physical  Exam  Constitutional: She is oriented to person, place, and time. She appears well-developed and well-nourished. No distress.  HENT:  Head: Normocephalic and atraumatic.  Right Ear: Hearing, tympanic membrane, external ear and ear canal normal.  Left Ear: Hearing, tympanic membrane, external ear and ear canal normal.  Nose: Nose normal.  Mouth/Throat: Oropharynx is clear and moist. No oropharyngeal exudate.  Nose is normal; no sinus tenderness  Eyes: Conjunctivae and EOM are normal. Pupils are equal, round, and reactive to light.  Cardiovascular: Normal rate, regular rhythm, normal heart sounds and intact distal pulses.   No murmur heard. Pulmonary/Chest: Effort normal and breath sounds normal. No respiratory distress. She has no  wheezes. She has no rhonchi.  Neurological: She is alert and oriented to person, place, and time.  Skin: Skin is warm and dry. No rash noted.  Psychiatric: She has a normal mood and affect. Her behavior is normal.  Vitals reviewed.   Filed Vitals:   03/07/15 1512  BP: 100/70  Pulse: 104  Temp: 98.4 F (36.9 C)  TempSrc: Oral  Resp: 16  Height: 5' 6.5" (1.689 m)  Weight: 145 lb 8 oz (65.998 kg)  SpO2: 99%       Assessment & Plan:   Haley Curry is a 34 y.o. female Acute recurrent sinusitis, unspecified location - Plan: amoxicillin-clavulanate (AUGMENTIN) 875-125 MG per tablet  Acute upper respiratory infection  Suspected viral URI vs early sinusitis with secondary sickening past 2 days.   - sx care as in AVS, saline NS, mucinex, short term use of Afrin if needed.   -if sinus sx's not improving in next few days - can start Augmentin - SED.   Meds ordered this encounter  Medications  . mometasone (NASONEX) 50 MCG/ACT nasal spray    Sig: Place 2 sprays into the nose daily.  Marland Kitchen amoxicillin-clavulanate (AUGMENTIN) 875-125 MG per tablet    Sig: Take 1 tablet by mouth 2 (two) times daily.    Dispense:  20 tablet    Refill:  0   Patient Instructions  Saline nasal spray atleast 4 times per day, over the counter mucinex, afrin only if needed and no more than three days in a row (would not combine afrin and sudafed). Drink plenty of fluids. If sinus pressure/discolored nasal discharge and pain not improving in next few days - can start Augmentin for possible sinus infection. Return to the clinic or go to the nearest emergency room if any of your symptoms worsen or new symptoms occur.  Upper Respiratory Infection, Adult An upper respiratory infection (URI) is also sometimes known as the common cold. The upper respiratory tract includes the nose, sinuses, throat, trachea, and bronchi. Bronchi are the airways leading to the lungs. Most people improve within 1 week, but symptoms can  last up to 2 weeks. A residual cough may last even longer.  CAUSES Many different viruses can infect the tissues lining the upper respiratory tract. The tissues become irritated and inflamed and often become very moist. Mucus production is also common. A cold is contagious. You can easily spread the virus to others by oral contact. This includes kissing, sharing a glass, coughing, or sneezing. Touching your mouth or nose and then touching a surface, which is then touched by another person, can also spread the virus. SYMPTOMS  Symptoms typically develop 1 to 3 days after you come in contact with a cold virus. Symptoms vary from person to person. They may include:  Runny nose.  Sneezing.  Nasal congestion.  Sinus irritation.  Sore throat.  Loss of voice (laryngitis).  Cough.  Fatigue.  Muscle aches.  Loss of appetite.  Headache.  Low-grade fever. DIAGNOSIS  You might diagnose your own cold based on familiar symptoms, since most people get a cold 2 to 3 times a year. Your caregiver can confirm this based on your exam. Most importantly, your caregiver can check that your symptoms are not due to another disease such as strep throat, sinusitis, pneumonia, asthma, or epiglottitis. Blood tests, throat tests, and X-rays are not necessary to diagnose a common cold, but they may sometimes be helpful in excluding other more serious diseases. Your caregiver will decide if any further tests are required. RISKS AND COMPLICATIONS  You may be at risk for a more severe case of the common cold if you smoke cigarettes, have chronic heart disease (such as heart failure) or lung disease (such as asthma), or if you have a weakened immune system. The very young and very old are also at risk for more serious infections. Bacterial sinusitis, middle ear infections, and bacterial pneumonia can complicate the common cold. The common cold can worsen asthma and chronic obstructive pulmonary disease (COPD).  Sometimes, these complications can require emergency medical care and may be life-threatening. PREVENTION  The best way to protect against getting a cold is to practice good hygiene. Avoid oral or hand contact with people with cold symptoms. Wash your hands often if contact occurs. There is no clear evidence that vitamin C, vitamin E, echinacea, or exercise reduces the chance of developing a cold. However, it is always recommended to get plenty of rest and practice good nutrition. TREATMENT  Treatment is directed at relieving symptoms. There is no cure. Antibiotics are not effective, because the infection is caused by a virus, not by bacteria. Treatment may include:  Increased fluid intake. Sports drinks offer valuable electrolytes, sugars, and fluids.  Breathing heated mist or steam (vaporizer or shower).  Eating chicken soup or other clear broths, and maintaining good nutrition.  Getting plenty of rest.  Using gargles or lozenges for comfort.  Controlling fevers with ibuprofen or acetaminophen as directed by your caregiver.  Increasing usage of your inhaler if you have asthma. Zinc gel and zinc lozenges, taken in the first 24 hours of the common cold, can shorten the duration and lessen the severity of symptoms. Pain medicines may help with fever, muscle aches, and throat pain. A variety of non-prescription medicines are available to treat congestion and runny nose. Your caregiver can make recommendations and may suggest nasal or lung inhalers for other symptoms.  HOME CARE INSTRUCTIONS   Only take over-the-counter or prescription medicines for pain, discomfort, or fever as directed by your caregiver.  Use a warm mist humidifier or inhale steam from a shower to increase air moisture. This may keep secretions moist and make it easier to breathe.  Drink enough water and fluids to keep your urine clear or pale yellow.  Rest as needed.  Return to work when your temperature has returned to  normal or as your caregiver advises. You may need to stay home longer to avoid infecting others. You can also use a face mask and careful hand washing to prevent spread of the virus. SEEK MEDICAL CARE IF:   After the first few days, you feel you are getting worse rather than better.  You need your caregiver's advice about medicines to control symptoms.  You develop chills, worsening shortness of breath, or brown or  red sputum. These may be signs of pneumonia.  You develop yellow or brown nasal discharge or pain in the face, especially when you bend forward. These may be signs of sinusitis.  You develop a fever, swollen neck glands, pain with swallowing, or white areas in the back of your throat. These may be signs of strep throat. SEEK IMMEDIATE MEDICAL CARE IF:   You have a fever.  You develop severe or persistent headache, ear pain, sinus pain, or chest pain.  You develop wheezing, a prolonged cough, cough up blood, or have a change in your usual mucus (if you have chronic lung disease).  You develop sore muscles or a stiff neck. Document Released: 03/21/2001 Document Revised: 12/18/2011 Document Reviewed: 12/31/2013 [facility] Patient Information 2015 [place], [place]. This information is not intended to replace advice given to you by your health care provider. Make sure you discuss any questions you have with your health care provider.   Sinusitis Sinusitis is redness, soreness, and inflammation of the paranasal sinuses. Paranasal sinuses are air pockets within the bones of your face (beneath the eyes, the middle of the forehead, or above the eyes). In healthy paranasal sinuses, mucus is able to drain out, and air is able to circulate through them by way of your nose. However, when your paranasal sinuses are inflamed, mucus and air can become trapped. This can allow bacteria and other germs to grow and cause infection. Sinusitis can develop quickly and last only a short time (acute) or  continue over a long period (chronic). Sinusitis that lasts for more than 12 weeks is considered chronic.  CAUSES  Causes of sinusitis include:  Allergies.  Structural abnormalities, such as displacement of the cartilage that separates your nostrils (deviated septum), which can decrease the air flow through your nose and sinuses and affect sinus drainage.  Functional abnormalities, such as when the small hairs (cilia) that line your sinuses and help remove mucus do not work properly or are not present. SIGNS AND SYMPTOMS  Symptoms of acute and chronic sinusitis are the same. The primary symptoms are pain and pressure around the affected sinuses. Other symptoms include:  Upper toothache.  Earache.  Headache.  Bad breath.  Decreased sense of smell and taste.  A cough, which worsens when you are lying flat.  Fatigue.  Fever.  Thick drainage from your nose, which often is green and may contain pus (purulent).  Swelling and warmth over the affected sinuses. DIAGNOSIS  Your health care provider will perform a physical exam. During the exam, your health care provider may:  Look in your nose for signs of abnormal growths in your nostrils (nasal polyps).  Tap over the affected sinus to check for signs of infection.  View the inside of your sinuses (endoscopy) using an imaging device that has a light attached (endoscope). If your health care provider suspects that you have chronic sinusitis, one or more of the following tests may be recommended:  Allergy tests.  Nasal culture. A sample of mucus is taken from your nose, sent to a lab, and screened for bacteria.  Nasal cytology. A sample of mucus is taken from your nose and examined by your health care provider to determine if your sinusitis is related to an allergy. TREATMENT  Most cases of acute sinusitis are related to a viral infection and will resolve on their own within 10 days. Sometimes medicines are prescribed to help  relieve symptoms (pain medicine, decongestants, nasal steroid sprays, or saline sprays).  However,  for sinusitis related to a bacterial infection, your health care provider will prescribe antibiotic medicines. These are medicines that will help kill the bacteria causing the infection.  Rarely, sinusitis is caused by a fungal infection. In theses cases, your health care provider will prescribe antifungal medicine. For some cases of chronic sinusitis, surgery is needed. Generally, these are cases in which sinusitis recurs more than 3 times per year, despite other treatments. HOME CARE INSTRUCTIONS   Drink plenty of water. Water helps thin the mucus so your sinuses can drain more easily.  Use a humidifier.  Inhale steam 3 to 4 times a day (for example, sit in the bathroom with the shower running).  Apply a warm, moist washcloth to your face 3 to 4 times a day, or as directed by your health care provider.  Use saline nasal sprays to help moisten and clean your sinuses.  Take medicines only as directed by your health care provider.  If you were prescribed either an antibiotic or antifungal medicine, finish it all even if you start to feel better. SEEK IMMEDIATE MEDICAL CARE IF:  You have increasing pain or severe headaches.  You have nausea, vomiting, or drowsiness.  You have swelling around your face.  You have vision problems.  You have a stiff neck.  You have difficulty breathing. MAKE SURE YOU:   Understand these instructions.  Will watch your condition.  Will get help right away if you are not doing well or get worse. Document Released: 09/25/2005 Document Revised: 02/09/2014 Document Reviewed: 10/10/2011 [facility] Patient Information 2015 [place], [place]. This information is not intended to replace advice given to you by your health care provider. Make sure you discuss any questions you have with your health care provider.     I personally performed the services described  in this documentation, which was scribed in my presence. The recorded information has been reviewed and considered, and addended by me as needed.

## 2015-12-23 IMAGING — CR DG WRIST COMPLETE 3+V*R*
3 series · 3 of 3 positions shown · non-contrast
Comparison: None.

CLINICAL DATA: Twisted right wrist 4-5 weeks ago.  Continued pain.

EXAM:
LEFT WRIST - COMPLETE 3+ VIEW

[PA]
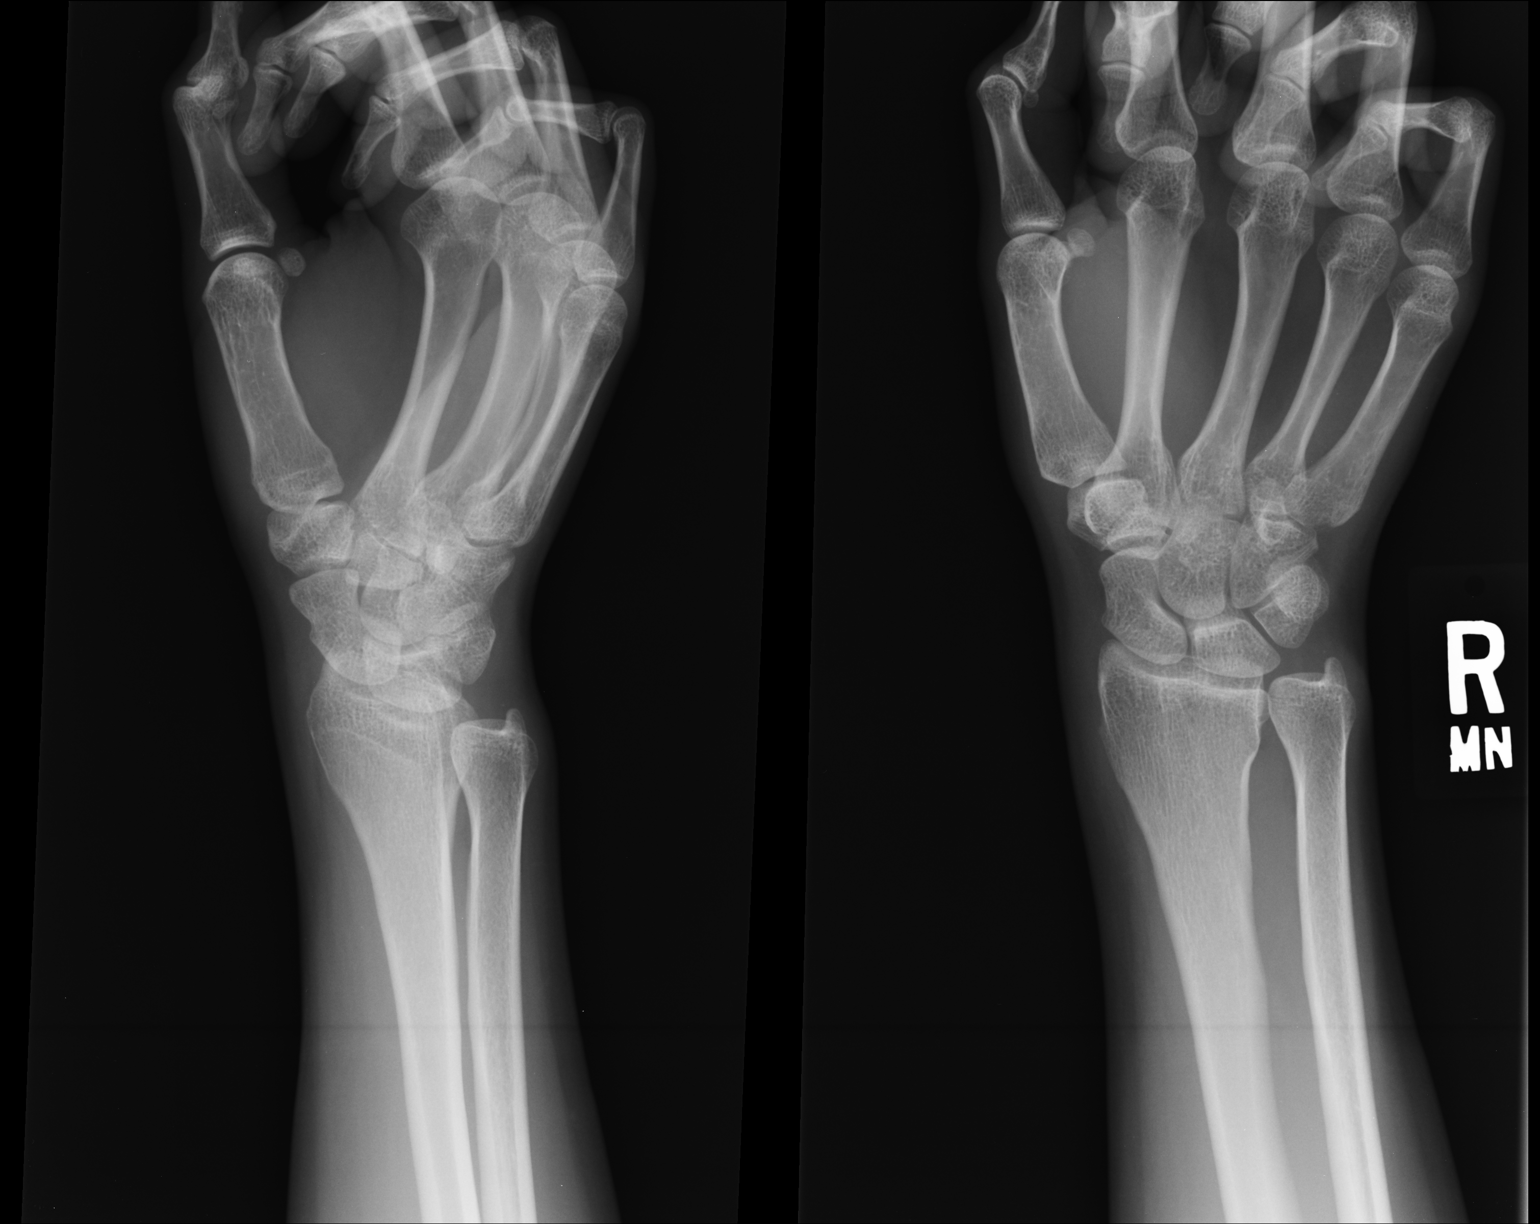

[lateral]
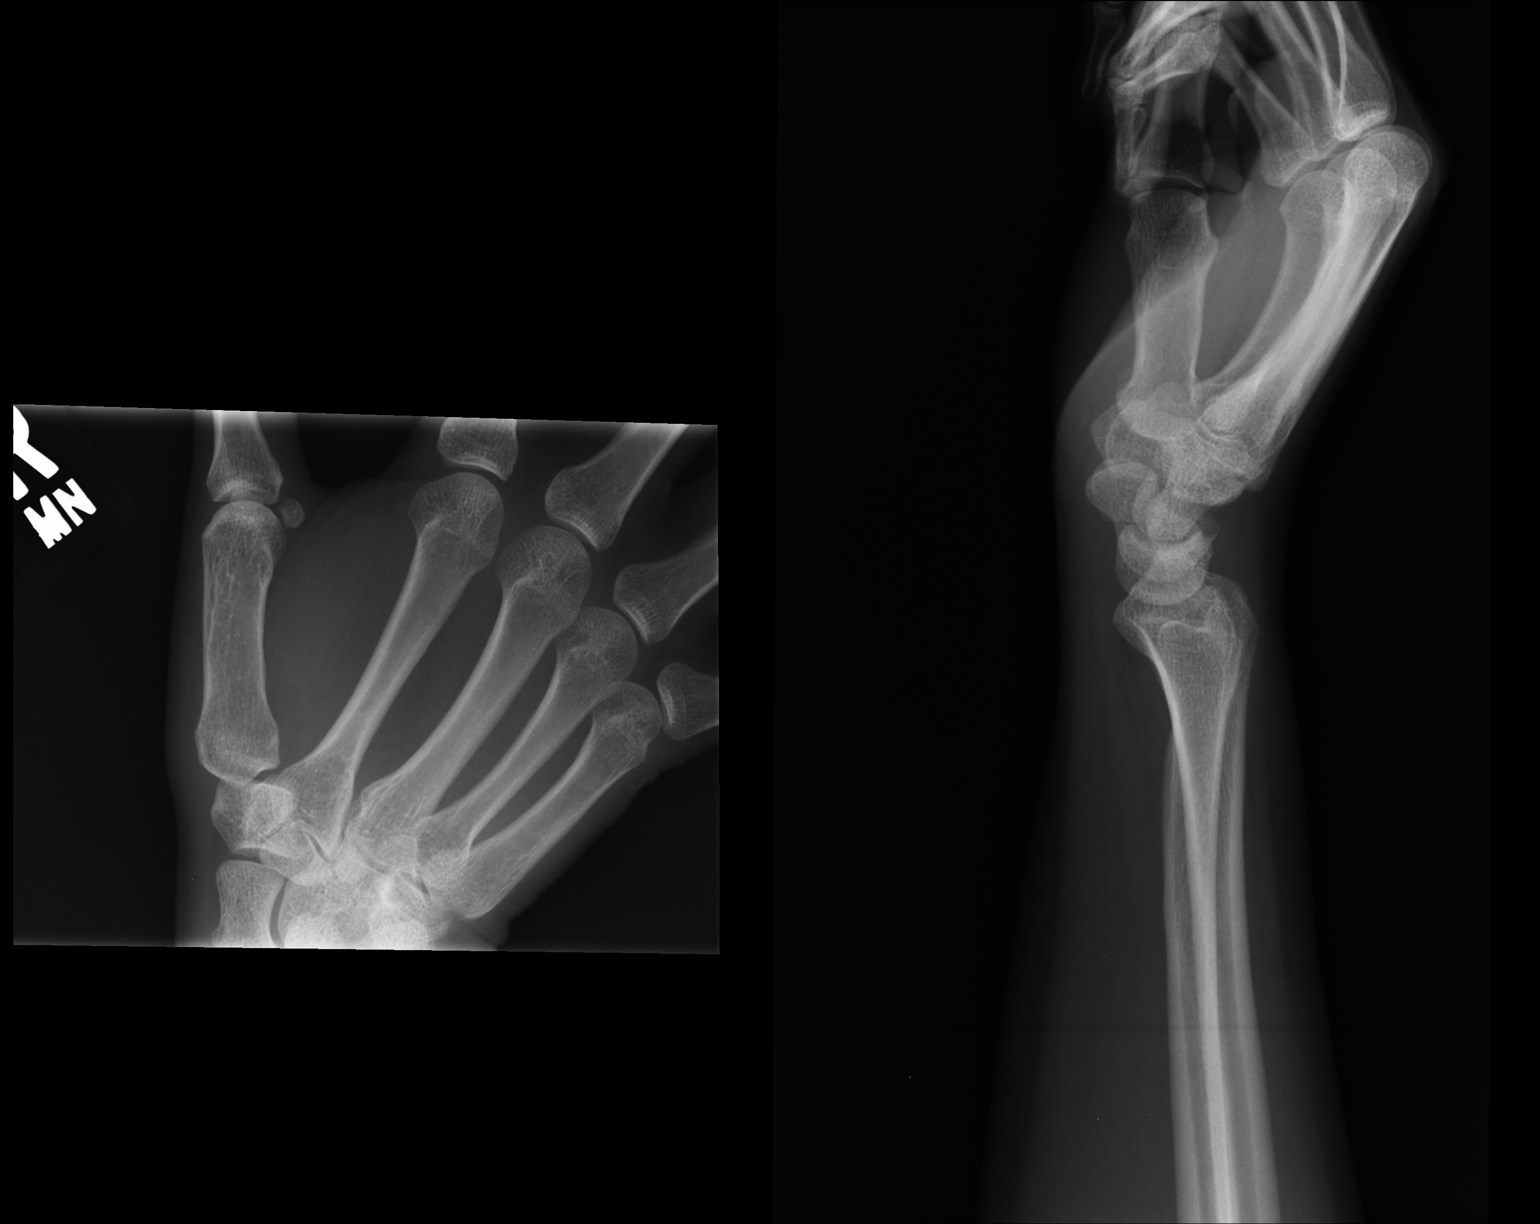

[pa navicular]
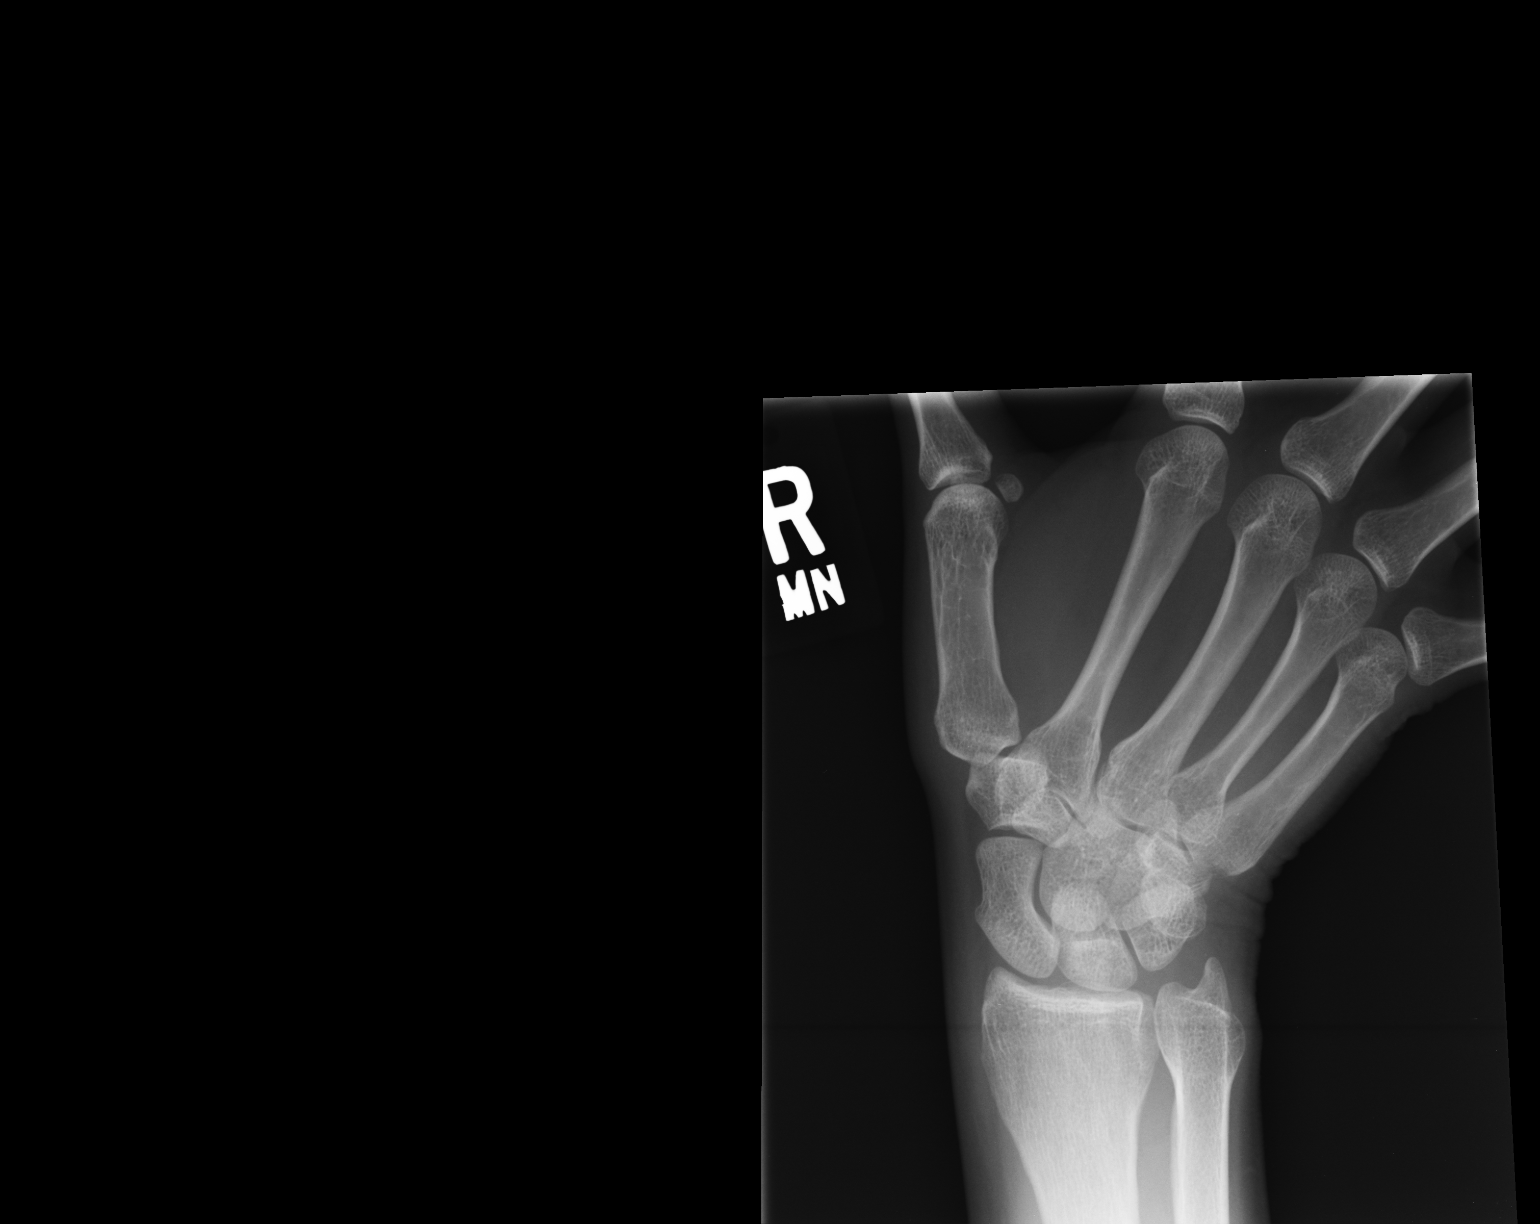

[3 of 3 positions shown; findings below may reference images not displayed]

FINDINGS: There is no evidence of fracture or dislocation. There is no
evidence of arthropathy or other focal bone abnormality. Soft
tissues are unremarkable.
IMPRESSION: Negative.

## 2016-07-07 ENCOUNTER — Ambulatory Visit (INDEPENDENT_AMBULATORY_CARE_PROVIDER_SITE_OTHER): Payer: BLUE CROSS/BLUE SHIELD | Admitting: Physician Assistant

## 2016-07-07 VITALS — BP 114/76 | HR 75 | Temp 98.4°F | Resp 16 | Ht 67.0 in | Wt 152.0 lb

## 2016-07-07 DIAGNOSIS — R070 Pain in throat: Secondary | ICD-10-CM

## 2016-07-07 DIAGNOSIS — J45990 Exercise induced bronchospasm: Secondary | ICD-10-CM

## 2016-07-07 LAB — POCT RAPID STREP A (OFFICE): Rapid Strep A Screen: NEGATIVE

## 2016-07-07 MED ORDER — AMOXICILLIN 500 MG PO CAPS: 500.0000 mg | ORAL_CAPSULE | Freq: Two times a day (BID) | ORAL | 0 refills | Status: DC

## 2016-07-07 MED ORDER — ALBUTEROL SULFATE HFA 108 (90 BASE) MCG/ACT IN AERS: 2.0000 | INHALATION_SPRAY | Freq: Four times a day (QID) | RESPIRATORY_TRACT | 5 refills | Status: AC | PRN

## 2016-07-07 NOTE — Patient Instructions (Addendum)
     IF you received an x-ray today, you will receive an invoice from [facility]. Please contact [facility] at [phone] with questions or concerns regarding your invoice.   IF you received labwork today, you will receive an invoice from [facility]. Please contact [facility] at [phone] with questions or concerns regarding your invoice.   Our billing staff will not be able to assist you with questions regarding bills from these companies.  You will be contacted with the lab results as soon as they are available. The fastest way to get your results is to activate your My Chart account. Instructions are located on the last page of this paperwork. If you have not heard from us regarding the results in 2 weeks, please contact this office.    Likely viral.   I would like you to gargle with warm salt water.  You can use over the counter mucinex, and daily allergy medications.   I will give you amoxicillin in case this does not improve while travel.  Fill if your symptoms do not improve within the next 4 days.    Pharyngitis Pharyngitis is a sore throat (pharynx). There is redness, pain, and swelling of your throat. HOME CARE   Drink enough fluids to keep your pee (urine) clear or pale yellow.  Only take medicine as told by your doctor.  You may get sick again if you do not take medicine as told. Finish your medicines, even if you start to feel better.  Do not take aspirin.  Rest.  Rinse your mouth (gargle) with salt water ( tsp of salt per 1 qt of water) every 1-2 hours. This will help the pain.  If you are not at risk for choking, you can suck on hard candy or sore throat lozenges. GET HELP IF:  You have large, tender lumps on your neck.  You have a rash.  You cough up green, yellow-brown, or bloody spit. GET HELP RIGHT AWAY IF:   You have a stiff neck.  You drool or cannot swallow liquids.  You throw up (vomit) or are not  able to keep medicine or liquids down.  You have very bad pain that does not go away with medicine.  You have problems breathing (not from a stuffy nose). MAKE SURE YOU:   Understand these instructions.  Will watch your condition.  Will get help right away if you are not doing well or get worse.   This information is not intended to replace advice given to you by your health care provider. Make sure you discuss any questions you have with your health care provider.   Document Released: 03/13/2008 Document Revised: 07/16/2013 Document Reviewed: 06/02/2013 Elsevier Interactive Patient Education [employer]2016 [employer]vier Inc.

## 2016-07-08 NOTE — Progress Notes (Signed)
[facility] [address], [place] [address] [phone]  Date:  07/07/2016   Name:  Haley Curry   DOB:  [birth year]   MRN:  [id number]  PCP:  Elvina Sidle, MD    History of Present Illness:  Haley Curry is a 35 y.o. female patient who presents to [facility] for throat pain.   This worsened last night into tiny pustules.  She has had weeks of intermittent congestion, sore thrat, and cough.  She has not taken much of anything for her symptoms.  She has no known sick contacts, but does travel a great deal, and is expecting to leave next week.  She denies much sneezing--mild watery eyes.    Patient Active Problem List   Diagnosis Date Noted  . Seasonal allergies 05/10/2012    Past Medical History:  Diagnosis Date  . Allergy   . Anemia   . Asthma     History reviewed. No pertinent surgical history.  Social History  Substance Use Topics  . Smoking status: Never Smoker  . Smokeless tobacco: Never Used  . Alcohol use Yes     Comment: social    Family History  Problem Relation Age of Onset  . Cancer Mother   . Diabetes Father   . Asthma Brother     Allergies  Allergen Reactions  . Omnicef [Cefdinir]     Medication list has been reviewed and updated.  Current Outpatient Prescriptions on File Prior to Visit  Medication Sig Dispense Refill  . ibuprofen (ADVIL,MOTRIN) 200 MG tablet Take 400 mg by mouth every 6 (six) hours as needed.    . mometasone (NASONEX) 50 MCG/ACT nasal spray Place 2 sprays into the nose daily.    . Norethindrone Acetate-Ethinyl Estrad-FE (LOESTRIN 24 FE) 1-20 MG-MCG(24) tablet Take 1 tablet by mouth daily.    . pseudoephedrine (SUDAFED) 120 MG 12 hr tablet Take 120 mg by mouth 2 (two) times daily.    . pseudoephedrine-guaifenesin (MUCINEX D) 60-600 MG per tablet Take 1 tablet by mouth every 12 (twelve) hours.     No current facility-administered medications on file prior to visit.     ROS   Physical Examination: BP  114/76 (BP Location: Right Arm, Patient Position: Sitting, Cuff Size: Normal)   Pulse 75   Temp 98.4 F (36.9 C) (Oral)   Resp 16   Ht 5\' 7"  (1.702 m)   Wt 152 lb (68.9 kg)   LMP 06/09/2016 (Approximate)   SpO2 100%   BMI 23.81 kg/m  Ideal Body Weight: Weight in (lb) to have BMI = 25: 159.3  Physical Exam  Constitutional: She is oriented to person, place, and time. She appears well-developed and well-nourished. No distress.  HENT:  Head: Normocephalic and atraumatic.  Right Ear: Tympanic membrane, external ear and ear canal normal.  Left Ear: Tympanic membrane, external ear and ear canal normal.  Nose: Mucosal edema and rhinorrhea present. Right sinus exhibits no maxillary sinus tenderness and no frontal sinus tenderness. Left sinus exhibits no maxillary sinus tenderness and no frontal sinus tenderness.  Mouth/Throat: No uvula swelling. No oropharyngeal exudate, posterior oropharyngeal edema or posterior oropharyngeal erythema.  Eyes: Conjunctivae and EOM are normal. Pupils are equal, round, and reactive to light.  Cardiovascular: Normal rate and regular rhythm.  Exam reveals no gallop, no distant heart sounds and no friction rub.   No murmur heard. Pulmonary/Chest: Effort normal. No respiratory distress. She has no decreased breath sounds. She has no wheezes.  She has no rhonchi.  Lymphadenopathy:       Head (right side): No submandibular, no tonsillar, no preauricular and no posterior auricular adenopathy present.       Head (left side): No submandibular, no tonsillar, no preauricular and no posterior auricular adenopathy present.  Neurological: She is alert and oriented to person, place, and time.  Skin: She is not diaphoretic.  Psychiatric: She has a normal mood and affect. Her behavior is normal.    Results for orders placed or performed in visit on 07/07/16  POCT rapid strep A  Result Value Ref Range   Rapid Strep A Screen Negative Negative   Assessment and Plan: Haley Curry is a 35 y.o. female who is here today for throat pain. Strep culture obtained.  This appears more viral. Advised her to gargle with warm salt water. She can also obtain over-the-counter Mucinex and internally allergy pill. I am printing out in 5 days for use in travel if her symptoms do not improve. Advised of alarming symptoms to warrant a more immediate return. Will refill albuterol.  This appears to be stable.  Throat pain - Plan: POCT rapid strep A, Culture, Group A Strep  Exercise-induced asthma - Plan: albuterol (PROVENTIL HFA;VENTOLIN HFA) 108 (90 Base) MCG/ACT inhaler Trena PlattStephanie Mayrene Bastarache, PA-C [facility] 9/30/201710:29 PM  I personally performed the services described in this documentation, which was scribed in my presence. The recorded information has been reviewed and is accurate.

## 2016-07-09 LAB — CULTURE, GROUP A STREP: ORGANISM ID, BACTERIA: NORMAL

## 2016-07-27 ENCOUNTER — Ambulatory Visit (INDEPENDENT_AMBULATORY_CARE_PROVIDER_SITE_OTHER): Payer: BLUE CROSS/BLUE SHIELD | Admitting: Family Medicine

## 2016-07-27 VITALS — BP 112/76 | HR 83 | Temp 98.3°F | Resp 16 | Ht 67.75 in | Wt 151.4 lb

## 2016-07-27 DIAGNOSIS — J029 Acute pharyngitis, unspecified: Secondary | ICD-10-CM

## 2016-07-27 DIAGNOSIS — J209 Acute bronchitis, unspecified: Secondary | ICD-10-CM

## 2016-07-27 LAB — POCT RAPID STREP A (OFFICE): Rapid Strep A Screen: NEGATIVE

## 2016-07-27 MED ORDER — AMOXICILLIN-POT CLAVULANATE 875-125 MG PO TABS: 1.0000 | ORAL_TABLET | Freq: Two times a day (BID) | ORAL | 0 refills | Status: DC

## 2016-07-27 MED ORDER — BENZONATATE 100 MG PO CAPS: 100.0000 mg | ORAL_CAPSULE | Freq: Three times a day (TID) | ORAL | 0 refills | Status: AC | PRN

## 2016-07-27 MED ORDER — FLUCONAZOLE 150 MG PO TABS: 150.0000 mg | ORAL_TABLET | Freq: Once | ORAL | 0 refills | Status: AC

## 2016-07-27 NOTE — Progress Notes (Signed)
Patient ID: Haley Curry, female    DOB: 10/11/1980, 35 y.o.   MRN: [id number]  PCP: Joaquin CourtsKimberly Dhamar Gregory, FNP  Chief Complaint  Patient presents with  . Sore Throat    Subjective:   HPI 35 year old, presents for evaluation of sore throat and chest congestion times 5 days. Fatigue, muscle aches, hoariness, PND, and non-productive cough which started yesterday. Cough started yesterday non-productive, with some shortness of breath. Uncertain of fever.  Negative for nausea or vomiting. She has drank hot tea, taken ibuprofen with minimal relief of symptoms. Had a sore throat with adenopathy 2-3 weeks ago and was given an antibiotic and which she did not take prescription  and  sore throat resolved.  Social History   Social History  . Marital status: Married    Spouse name: N/A  . Number of children: N/A  . Years of education: N/A   Occupational History  . Not on file.   Social History Main Topics  . Smoking status: Never Smoker  . Smokeless tobacco: Never Used  . Alcohol use Yes     Comment: social  . Drug use: No  . Sexual activity: Yes    Birth control/ protection: Pill   Other Topics Concern  . Not on file   Social History Narrative  . No narrative on file   Family History  Problem Relation Age of Onset  . Cancer Mother   . Diabetes Father   . Asthma Brother      Review of Systems See HPI Social History   Social History  . Marital status: Married    Spouse name: N/A  . Number of children: N/A  . Years of education: N/A   Occupational History  . Not on file.   Social History Main Topics  . Smoking status: Never Smoker  . Smokeless tobacco: Never Used  . Alcohol use Yes     Comment: social  . Drug use: No  . Sexual activity: Yes    Birth control/ protection: Pill   Other Topics Concern  . Not on file   Social History Narrative  . No narrative on file   Family History  Problem Relation Age of Onset  . Cancer Mother   . Diabetes Father   .  Asthma Brother      Patient Active Problem List   Diagnosis Date Noted  . Seasonal allergies 05/10/2012     Prior to Admission medications   Medication Sig Start Date End Date Taking? Authorizing Provider  albuterol (PROVENTIL HFA;VENTOLIN HFA) 108 (90 Base) MCG/ACT inhaler Inhale 2 puffs into the lungs every 6 (six) hours as needed for wheezing. 07/07/16  Yes Collie SiadStephanie D English, PA  ibuprofen (ADVIL,MOTRIN) 200 MG tablet Take 400 mg by mouth every 6 (six) hours as needed.   Yes Historical Provider, MD  Levocetirizine Dihydrochloride (XYZAL PO) Take by mouth.   Yes Historical Provider, MD  mometasone (NASONEX) 50 MCG/ACT nasal spray Place 2 sprays into the nose daily.   Yes Historical Provider, MD  Norethindrone Acetate-Ethinyl Estrad-FE (LOESTRIN 24 FE) 1-20 MG-MCG(24) tablet Take 1 tablet by mouth daily.   Yes Historical Provider, MD  pseudoephedrine (SUDAFED) 120 MG 12 hr tablet Take 120 mg by mouth 2 (two) times daily.   Yes Historical Provider, MD  pseudoephedrine-guaifenesin (MUCINEX D) 60-600 MG per tablet Take 1 tablet by mouth every 12 (twelve) hours.   Yes Historical Provider, MD  amoxicillin (AMOXIL) 500 MG capsule Take 1 capsule (500 mg total)  by mouth 2 (two) times daily. Patient not taking: Reported on 07/27/2016 07/07/16   Collie Siad English, PA   Allergies  Allergen Reactions  . Omnicef [Cefdinir]       Vitals:   07/27/16 0817  BP: 112/76  Pulse: 83  Resp: 16  Temp: 98.3 F (36.8 C)   Objective:  Physical Exam  Constitutional: She is oriented to person, place, and time. She appears well-developed and well-nourished.  HENT:  Head: Normocephalic and atraumatic.  Right Ear: External ear normal.  Left Ear: External ear normal.  Nose: Nose normal.  Mouth/Throat: Oropharynx is clear and moist. No oropharyngeal exudate.  Eyes: Conjunctivae and EOM are normal. Pupils are equal, round, and reactive to light.  Neck: Normal range of motion. Neck supple.    Cardiovascular: Normal rate, regular rhythm, normal heart sounds and intact distal pulses.   Pulmonary/Chest: Effort normal and breath sounds normal. She exhibits tenderness.  Dry, hacking, cough observed during examination   Musculoskeletal: Normal range of motion.  Lymphadenopathy:    She has no cervical adenopathy.  Neurological: She is alert and oriented to person, place, and time. She has normal reflexes.  Skin: Skin is warm and dry.  Psychiatric: She has a normal mood and affect. Her behavior is normal. Judgment and thought content normal.      Assessment & Plan:  1. Acute bronchitis, unspecified organism,[id number]* 2. Sore throat - POCT rapid strep A-negative - Culture, Group A Strep  Plan: . amoxicillin-clavulanate (AUGMENTIN) 875-125 MG tablet    Sig: Take 1 tablet by mouth 2 (two) times daily.  . benzonatate (TESSALON) 100 MG capsule    Sig: Take 1-2 capsules (100-200 mg total) by mouth 3 (three) times daily as needed for cough.  . fluconazole (DIFLUCAN) 150 MG tablet    Sig: Take 1 tablet (150 mg total) by mouth once. Repeat if needed    Follow-up as needed.  Godfrey Pick. Tiburcio Pea, MSN, FNP-C [facility]

## 2016-07-27 NOTE — Patient Instructions (Addendum)
Take Augmentin 875-125 1 pill twice daily for 10 days.   You may take Diflucan 150 mg once-twice if needed for vaginal irritation that may occur with Augmentin.  Take Benzonatate 100-200 mg up to 3 times daily for cough.  Continue Mucinex 1200 mg twice daily.    IF you received an x-ray today, you will receive an invoice from [facility]. Please contact [facility] at [phone] with questions or concerns regarding your invoice.   IF you received labwork today, you will receive an invoice from [facility]. Please contact [facility] at [phone] with questions or concerns regarding your invoice.   Our billing staff will not be able to assist you with questions regarding bills from these companies.  You will be contacted with the lab results as soon as they are available. The fastest way to get your results is to activate your My Chart account. Instructions are located on the last page of this paperwork. If you have not heard from us regarding the results in 2 weeks, please contact this office.

## 2016-07-28 LAB — CULTURE, GROUP A STREP: Organism ID, Bacteria: NORMAL

## 2016-07-31 ENCOUNTER — Encounter: Payer: Self-pay | Admitting: Family Medicine

## 2016-12-01 ENCOUNTER — Ambulatory Visit (INDEPENDENT_AMBULATORY_CARE_PROVIDER_SITE_OTHER): Payer: BLUE CROSS/BLUE SHIELD | Admitting: Family Medicine

## 2016-12-01 ENCOUNTER — Ambulatory Visit (INDEPENDENT_AMBULATORY_CARE_PROVIDER_SITE_OTHER): Payer: BLUE CROSS/BLUE SHIELD

## 2016-12-01 VITALS — BP 128/80 | HR 88 | Temp 97.9°F | Resp 16 | Ht 67.75 in | Wt 145.2 lb

## 2016-12-01 DIAGNOSIS — R0781 Pleurodynia: Secondary | ICD-10-CM | POA: Diagnosis not present

## 2016-12-01 LAB — POCT URINALYSIS DIP (MANUAL ENTRY)
BILIRUBIN UA: NEGATIVE
Glucose, UA: NEGATIVE
LEUKOCYTES UA: NEGATIVE
Nitrite, UA: NEGATIVE
PH UA: 5.5
Protein Ur, POC: 30 — AB
Spec Grav, UA: >=1.03
Urobilinogen, UA: 0.2

## 2016-12-01 LAB — POCT CBC
Granulocyte percent: 48 %G (ref 37–80)
HEMATOCRIT: 41.1 % (ref 37.7–47.9)
HEMOGLOBIN: 14.5 g/dL (ref 12.2–16.2)
LYMPH, POC: 3.7 — AB (ref 0.6–3.4)
MCH, POC: 32.5 pg — AB (ref 27–31.2)
MCHC: 35.2 g/dL (ref 31.8–35.4)
MCV: 92.2 fL (ref 80–97)
MID (cbc): 0.5 (ref 0–0.9)
MPV: 7.8 fL (ref 0–99.8)
POC GRANULOCYTE: 3.8 (ref 2–6.9)
POC LYMPH PERCENT: 46.3 %L (ref 10–50)
POC MID %: 5.7 % (ref 0–12)
Platelet Count, POC: 373 10*3/uL (ref 142–424)
RBC: 4.46 M/uL (ref 4.04–5.48)
RDW, POC: 12.7 %
WBC: 7.9 10*3/uL (ref 4.6–10.2)

## 2016-12-01 LAB — POCT URINE PREGNANCY: Preg Test, Ur: NEGATIVE

## 2016-12-01 MED ORDER — OMEPRAZOLE 40 MG PO CPDR: 40.0000 mg | DELAYED_RELEASE_CAPSULE | Freq: Every day | ORAL | 2 refills | Status: AC

## 2016-12-01 MED ORDER — PREDNISONE 20 MG PO TABS: ORAL_TABLET | ORAL | 0 refills | Status: AC

## 2016-12-01 NOTE — Patient Instructions (Addendum)
   IF you received an x-ray today, you will receive an invoice from [facility]. Please contact [facility] at [phone] with questions or concerns regarding your invoice.   IF you received labwork today, you will receive an invoice from [facility]. Please contact [facility] at [phone] with questions or concerns regarding your invoice.   Our billing staff will not be able to assist you with questions regarding bills from these companies.  You will be contacted with the lab results as soon as they are available. The fastest way to get your results is to activate your My Chart account. Instructions are located on the last page of this paperwork. If you have not heard from us regarding the results in 2 weeks, please contact this office.      Pleurisy Pleurisy, also called pleuritis, is irritation and swelling (inflammation) of the linings of the lungs. The linings of the lungs are called pleura. They cover the outside of the lungs and the inside of the chest wall. There is a small amount of fluid (pleural fluid) between the pleura that allows the lungs to move in and out smoothly when you breathe. Pleurisy causes the pleura to be rough and dry and to rub together when you breathe, which is painful. In some cases, pleurisy can cause pleural fluid to build up between the pleura (pleural effusion). What are the causes? Common causes of this condition include:  A lung infection caused by bacteria or a virus.  A blood clot that travels to the lung (pulmonary embolism).  Air leaking into the pleural space (pneumothorax).  Lung cancer or a lung tumor.  A chest injury.  Diseases that can cause lung inflammation. These include rheumatoid arthritis, lupus, sickle cell disease, inflammatory bowel disease, and pancreatitis.  Heart or chest surgery.  Lung damage from inhaling asbestos.  A lung reaction to certain medicines.  Sometimes the cause is unknown. What are the  signs or symptoms? Chest pain is the main symptom of this condition. The pain is usually on one side. Chest pain may start suddenly and be sharp or stabbing. It may become a constant dull ache. You may also feel pain in your back or shoulder. The pain may get worse when you cough, take deep breaths, or make sudden movements. Other symptoms may include:  Shortness of breath.  Noisy breathing (wheezing).  Cough.  Chills.  Fever.  How is this diagnosed? This condition may be diagnosed based on:  Your medical history.  Your symptoms.  A physical exam. Your health care provider will listen to your breathing with a stethoscope to check for a rough, rubbing sound (friction rub). If you have pleural effusion, your breathing sounds may be muffled.  Tests, such as: ? Blood tests to check for infections or diseases and to measure the oxygen in your blood. ? Imaging studies of your lungs. These may include a chest X-ray, ultrasound, MRI, or CT scan. ? A procedure to remove pleural fluid with a needle for testing (thoracentesis).  How is this treated? Treatment for this condition depends on the cause. Pleurisy that was caused by a virus usually clears up within 2 weeks. Treatment for pleurisy may include:  NSAIDs to help relieve pain and swelling.  Antibiotic medicines, if your condition was caused by a bacterial infection.  Prescription pain or cough medicine.  Medicines to dissolve a blood clot, if your condition was caused by pulmonary embolism.  Removal of pleural fluid or air.  Follow these instructions   at home: Medicines  Take over-the-counter and prescription medicines only as told by your health care provider.  If you were prescribed an antibiotic, take it as told by your health care provider. Do not stop taking the antibiotic even if you start to feel better. Activity  Rest and return to your normal activities as told by your health care provider. Ask your health care  provider what activities are safe for you.  Do not drive or use heavy machinery while taking prescription pain medicine. General instructions  Monitor your pleurisy for any changes.  Take deep breaths often, even if it is painful. This can help prevent lung infection (pneumonia) and collapse of lung tissue (atelectasis).  When lying down, lie on your painful side. This may reduce pain.  Do not smoke. If you need help quitting, ask your health care provider.  Keep all follow-up visits as told by your health care provider. This is important. Contact a health care provider if:  You have pain that: ? Gets worse. ? Does not get better with medicine. ? Lasts for more than 1 week.  You have a fever or chills.  Your cough or shortness of breath is not improving at home.  You cough up pus-like (purulent) secretions. Get help right away if:  Your lips, fingernails, or toenails darken or turn blue.  You cough up blood.  You have any of the following symptoms that get worse: ? Difficulty breathing. ? Shortness of breath. ? Wheezing.  You have pain that spreads into your neck, arms, or jaw.  You develop a rash.  You vomit.  You faint. Summary  Pleurisy is inflammation of the linings of the lungs (pleura).  Pleurisy causes pain that makes it difficult for you to breathe or cough.  Pleurisy is often caused by an underlying infection or disease.  Treatment of pleurisy depends on the cause, and it often includes medicines. This information is not intended to replace advice given to you by your health care provider. Make sure you discuss any questions you have with your health care provider. Document Released: 09/25/2005 Document Revised: 06/19/2016 Document Reviewed: 06/19/2016 Elsevier Interactive Patient Education  2017 Elsevier Inc.  

## 2016-12-01 NOTE — Progress Notes (Signed)
Subjective:  By signing my name below, I, Essence Howell, attest that this documentation has been prepared under the direction and in the presence of Norberto Sorenson, MD Electronically Signed: Charline Bills, ED Scribe 12/01/2016 at 6:11 PM.   Patient ID: Haley Curry, female    DOB: 10/23/1980, 36 y.o.   MRN: [id number]  Chief Complaint  Patient presents with  . Flank Pain    left side below breast under ribs   HPI HPI Comments: Haley Curry is a 36 y.o. female who presents to Primary Care at [facility] complaining of gradually worsening left flank pain over the past 2 months. Pt initially noticed left flank pain after the flu in December. She states that flu-like symptoms resolved, however left flank pain did not and she noticed a lingering cough a few weeks later. Pt reports a short span of cold-like symptoms ~4 weeks ago and states that left flank pain worsened although she didn't seem to cough much with the cold. She also noticed an enlarged tender right axillary lymph node a few days later. Pt was seen at [facility], told that she pulled a muscle and treated with 800 mg ibuprofen and heat which she has been compliant with worsening symptoms. She reports increased pain with deep breathing, palpation and initially lying backwards. Pt denies increased pain with bending forward, nausea, vomiting, changes in urine or bowels, sob, persistent cough, any other lymph nodes.   Past Medical History:  Diagnosis Date  . Allergy   . Anemia   . Asthma    Current Outpatient Prescriptions on File Prior to Visit  Medication Sig Dispense Refill  . albuterol (PROVENTIL HFA;VENTOLIN HFA) 108 (90 Base) MCG/ACT inhaler Inhale 2 puffs into the lungs every 6 (six) hours as needed for wheezing. 1 Inhaler 5  . benzonatate (TESSALON) 100 MG capsule Take 1-2 capsules (100-200 mg total) by mouth 3 (three) times daily as needed for cough. 40 capsule 0  . ibuprofen (ADVIL,MOTRIN) 200 MG tablet Take 400 mg by mouth every 6  (six) hours as needed.    . Levocetirizine Dihydrochloride (XYZAL PO) Take by mouth.    . mometasone (NASONEX) 50 MCG/ACT nasal spray Place 2 sprays into the nose daily.    . Norethindrone Acetate-Ethinyl Estrad-FE (LOESTRIN 24 FE) 1-20 MG-MCG(24) tablet Take 1 tablet by mouth daily.    . pseudoephedrine (SUDAFED) 120 MG 12 hr tablet Take 120 mg by mouth 2 (two) times daily.    . pseudoephedrine-guaifenesin (MUCINEX D) 60-600 MG per tablet Take 1 tablet by mouth every 12 (twelve) hours.    Marland Kitchen amoxicillin (AMOXIL) 500 MG capsule Take 1 capsule (500 mg total) by mouth 2 (two) times daily. (Patient not taking: Reported on 07/27/2016) 20 capsule 0  . amoxicillin-clavulanate (AUGMENTIN) 875-125 MG tablet Take 1 tablet by mouth 2 (two) times daily. (Patient not taking: Reported on 12/01/2016) 20 tablet 0   No current facility-administered medications on file prior to visit.    Allergies  Allergen Reactions  . Omnicef [Cefdinir]    Review of Systems  Respiratory: Negative for cough and shortness of breath.   Gastrointestinal: Negative for constipation, nausea and vomiting.  Genitourinary: Positive for flank pain. Negative for difficulty urinating and hematuria.  Hematological: Negative for adenopathy.      Objective:   Physical Exam  Constitutional: She is oriented to person, place, and time. She appears well-developed and well-nourished. No distress.  HENT:  Head: Normocephalic and atraumatic.  Eyes: Conjunctivae and EOM are normal.  Neck: Neck supple. No tracheal deviation present.  Cardiovascular: Normal rate.   Pulmonary/Chest: Effort normal. No respiratory distress.  Abdominal:  No point tenderness to palpation over the ribs but positive tenderness along the costal margin and below with palpation superiorly, worse when pt is upright, improved when supine. Pain induced with firm palpation of anterior chest wall and ribs but not light.   Musculoskeletal: Normal range of motion.    Neurological: She is alert and oriented to person, place, and time.  Skin: Skin is warm and dry.  Psychiatric: She has a normal mood and affect. Her behavior is normal.  Nursing note and vitals reviewed.  BP 128/80 (BP Location: Right Arm, Patient Position: Sitting, Cuff Size: Small)   Pulse 88   Temp 97.9 F (36.6 C) (Oral)   Resp 16   Ht 5' 7.75" (1.721 m)   Wt 145 lb 3.2 oz (65.9 kg)   LMP 11/03/2016   SpO2 100%   BMI 22.24 kg/m     Results for orders placed or performed in visit on 12/01/16  Sedimentation Rate  Result Value Ref Range   Sed Rate 2 0 - 32 mm/hr  C-reactive protein  Result Value Ref Range   CRP 3.2 0.0 - 4.9 mg/L  POCT CBC  Result Value Ref Range   WBC 7.9 4.6 - 10.2 K/uL   Lymph, poc 3.7 (A) 0.6 - 3.4   POC LYMPH PERCENT 46.3 10 - 50 %L   MID (cbc) 0.5 0 - 0.9   POC MID % 5.7 0 - 12 %M   POC Granulocyte 3.8 2 - 6.9   Granulocyte percent 48.0 37 - 80 %G   RBC 4.46 4.04 - 5.48 M/uL   Hemoglobin 14.5 12.2 - 16.2 g/dL   HCT, POC 16.1 09.6 - 47.9 %   MCV 92.2 80 - 97 fL   MCH, POC 32.5 (A) 27 - 31.2 pg   MCHC 35.2 31.8 - 35.4 g/dL   RDW, POC 04.5 %   Platelet Count, POC 373 142 - 424 K/uL   MPV 7.8 0 - 99.8 fL  POCT urinalysis dipstick  Result Value Ref Range   Color, UA yellow yellow   Clarity, UA clear clear   Glucose, UA negative negative   Bilirubin, UA negative negative   Ketones, POC UA trace (5) (A) negative   Spec Grav, UA >=1.030    Blood, UA large (A) negative   pH, UA 5.5    Protein Ur, POC =30 (A) negative   Urobilinogen, UA 0.2    Nitrite, UA Negative Negative   Leukocytes, UA Negative Negative  POCT urine pregnancy  Result Value Ref Range   Preg Test, Ur Negative Negative   EXAM: LEFT RIBS AND CHEST - 3+ VIEW  COMPARISON:  None.  FINDINGS: No fracture or other bone lesions are seen involving the ribs. There is no evidence of pneumothorax or pleural effusion. Both lungs are clear. Heart size and mediastinal contours  are within normal limits.  IMPRESSION: Negative.  Assessment & Plan:   1. Pleuritic chest pain   Suspect pleurisy  Orders Placed This Encounter  Procedures  . DG Ribs Unilateral W/Chest Left    Standing Status:   Future    Number of Occurrences:   1    Standing Expiration Date:   12/01/2017    Order Specific Question:   Reason for Exam (SYMPTOM  OR DIAGNOSIS REQUIRED)    Answer:   left lower anterior rib pain >  1 mo after recurrent URIs worsening on ibuprofen    Order Specific Question:   Is the patient pregnant?    Answer:   No    Order Specific Question:   Preferred imaging location?    Answer:   External  . Sedimentation Rate  . C-reactive protein  . POCT CBC  . POCT urinalysis dipstick  . POCT urine pregnancy    Meds ordered this encounter  Medications  . predniSONE (DELTASONE) 20 MG tablet    Sig: Take 3 tabs qd x 3d, then 2 tabs qd x 3d then 1 tab qd x 3d.    Dispense:  18 tablet    Refill:  0  . omeprazole (PRILOSEC) 40 MG capsule    Sig: Take 1 capsule (40 mg total) by mouth daily. 30 minutes before a meal    Dispense:  30 capsule    Refill:  2    I personally performed the services described in this documentation, which was scribed in my presence. The recorded information has been reviewed and considered, and addended by me as needed.   Norberto SorensonEva Shaw, M.D.  Primary Care at [facility]   [address] [place] [address] [phone] phone [phone] fax  01/04/17 10:57 PM

## 2016-12-02 LAB — SEDIMENTATION RATE: Sed Rate: 2 mm/hr (ref 0–32)

## 2016-12-02 LAB — C-REACTIVE PROTEIN: CRP: 3.2 mg/L (ref 0.0–4.9)

## 2018-08-22 ENCOUNTER — Ambulatory Visit (HOSPITAL_COMMUNITY)
Admission: EM | Admit: 2018-08-22 | Discharge: 2018-08-22 | Disposition: A | Discharge status: Home / Self Care | Payer: BLUE CROSS/BLUE SHIELD | Attending: Family Medicine | Admitting: Family Medicine

## 2018-08-22 ENCOUNTER — Encounter (HOSPITAL_COMMUNITY): Payer: Self-pay

## 2018-08-22 ENCOUNTER — Other Ambulatory Visit: Payer: Self-pay

## 2018-08-22 ENCOUNTER — Ambulatory Visit (INDEPENDENT_AMBULATORY_CARE_PROVIDER_SITE_OTHER): Payer: BLUE CROSS/BLUE SHIELD

## 2018-08-22 DIAGNOSIS — A084 Viral intestinal infection, unspecified: Secondary | ICD-10-CM

## 2018-08-22 MED ORDER — ONDANSETRON HCL 4 MG PO TABS: 4.0000 mg | ORAL_TABLET | Freq: Four times a day (QID) | ORAL | 0 refills | Status: AC

## 2018-08-22 NOTE — ED Triage Notes (Signed)
Pt states she was on a flight and began to have a stomach pain. This started Tuesday. Pt states she has stomach bloating and the pain is unbearable at times. She also had some vomiting.

## 2018-08-22 NOTE — ED Provider Notes (Signed)
[facility]   [id number] 08/22/18 Arrival Time: 0856  CC: ABDOMINAL DISCOMFORT  SUBJECTIVE:  Haley Curry is a 37 y.o. female who presents with abdominal discomfort and vomiting that began 2 days ago.  Reports close contacts with similar symptoms.  Had 2 episodes of NB/NB emesis and dry heaves on day 1 that has since resolved.  Now within the last day she has experiened bloating, nausea, difficulty passing gas, and smaller bowel movements.  Pain is diffuse about the abdomen.  She describes as constant, but improving.  Was a 10/10 this AM, but now 5/10 after having a BM that was smaller than normal.  States it feels like a "snake" is moving around her intestines.  Has tried OTC medications without relief.  Symptoms are made worse with eating.  Denies similar symptoms in the past.  Complains of chills, decreased appetite, and looser stools.    Denies fever,  chest pain, SOB, diarrhea, constipation, hematochezia, melena, dysuria, difficulty urinating, increased frequency or urgency, flank pain, loss of bowel or bladder function, vaginal discharge, vaginal odor, vaginal bleeding, dyspareunia, pelvic pain.     Patient's last menstrual period was 08/08/2018 (exact date).  ROS: As per HPI.  Past Medical History:  Diagnosis Date  . Allergy   . Anemia   . Asthma    History reviewed. No pertinent surgical history. Allergies  Allergen Reactions  . Omnicef [Cefdinir]    No current facility-administered medications on file prior to encounter.    Current Outpatient Medications on File Prior to Encounter  Medication Sig Dispense Refill  . albuterol (PROVENTIL HFA;VENTOLIN HFA) 108 (90 Base) MCG/ACT inhaler Inhale 2 puffs into the lungs every 6 (six) hours as needed for wheezing. 1 Inhaler 5  . benzonatate (TESSALON) 100 MG capsule Take 1-2 capsules (100-200 mg total) by mouth 3 (three) times daily as needed for cough. 40 capsule 0  . Levocetirizine Dihydrochloride (XYZAL PO) Take by  mouth.    . mometasone (NASONEX) 50 MCG/ACT nasal spray Place 2 sprays into the nose daily.    . Norethindrone Acetate-Ethinyl Estrad-FE (LOESTRIN 24 FE) 1-20 MG-MCG(24) tablet Take 1 tablet by mouth daily.    Marland Kitchen. omeprazole (PRILOSEC) 40 MG capsule Take 1 capsule (40 mg total) by mouth daily. 30 minutes before a meal 30 capsule 2  . predniSONE (DELTASONE) 20 MG tablet Take 3 tabs qd x 3d, then 2 tabs qd x 3d then 1 tab qd x 3d. 18 tablet 0  . pseudoephedrine (SUDAFED) 120 MG 12 hr tablet Take 120 mg by mouth 2 (two) times daily.    . pseudoephedrine-guaifenesin (MUCINEX D) 60-600 MG per tablet Take 1 tablet by mouth every 12 (twelve) hours.     Social History   Socioeconomic History  . Marital status: Married    Spouse name: Not on file  . Number of children: Not on file  . Years of education: Not on file  . Highest education level: Not on file  Occupational History  . Not on file  Social Needs  . Financial resource strain: Not on file  . Food insecurity:    Worry: Not on file    Inability: Not on file  . Transportation needs:    Medical: Not on file    Non-medical: Not on file  Tobacco Use  . Smoking status: Never Smoker  . Smokeless tobacco: Never Used  Substance and Sexual Activity  . Alcohol use: Yes    Comment: social  . Drug use: No  .  Sexual activity: Yes    Birth control/protection: Pill  Lifestyle  . Physical activity:    Days per week: Not on file    Minutes per session: Not on file  . Stress: Not on file  Relationships  . Social connections:    Talks on phone: Not on file    Gets together: Not on file    Attends religious service: Not on file    Active member of club or organization: Not on file    Attends meetings of clubs or organizations: Not on file    Relationship status: Not on file  . Intimate partner violence:    Fear of current or ex partner: Not on file    Emotionally abused: Not on file    Physically abused: Not on file    Forced sexual  activity: Not on file  Other Topics Concern  . Not on file  Social History Narrative  . Not on file   Family History  Problem Relation Age of Onset  . Cancer Mother   . Diabetes Father   . Asthma Brother      OBJECTIVE:  Vitals:   08/22/18 0956 08/22/18 0957  BP: 114/82   Pulse: 80   Resp: 16   Temp: 98.4 F (36.9 C)   TempSrc: Oral   SpO2: 98%   Weight:  155 lb (70.3 kg)    General appearance: Alert; NAD HEENT: NCAT.  Oropharynx clear.  Lungs: clear to auscultation bilaterally without adventitious breath sounds Heart: regular rate and rhythm.  Radial pulses 2+ symmetrical bilaterally Abdomen: soft, non-distended; decreased active bowel sounds; mild diffuse tender about the abdomen; nontender at McBurney's point; negative Murphy's sign; negative rebound; + guarding, but patient states she is ticklish  Back: no CVA tenderness Extremities: no edema; symmetrical with no gross deformities Skin: warm and dry Neurologic: normal gait Psychological: alert and cooperative; normal mood and affect  DIAGNOSTIC STUDIES: Dg Abd 2 Views  Result Date: 08/22/2018 CLINICAL DATA:  Abdominal pain EXAM: ABDOMEN - 2 VIEW COMPARISON:  None. FINDINGS: There is normal bowel gas pattern. No free air. No organomegaly or suspicious calcification. No acute bony abnormality. IMPRESSION: Negative. Electronically Signed   By: Charlett Nose M.D.   On: 08/22/2018 10:42     ASSESSMENT & PLAN:  1. Viral gastroenteritis     Meds ordered this encounter  Medications  . ondansetron (ZOFRAN) 4 MG tablet    Sig: Take 1 tablet (4 mg total) by mouth every 6 (six) hours.    Dispense:  12 tablet    Refill:  0    Order Specific Question:   Supervising Provider    Answer:   Isa Rankin [[id number]]   X-ray did not show bowel obstruction.  Symptoms most likely related to viral illness.   Get rest and drink fluids Zofran prescribed.  Take as directed.    DIET Instructions:  30 minutes after taking  nausea medicine, begin with sips of clear liquids. If able to hold down 2 - 4 ounces for 30 minutes, begin drinking more. Increase your fluid intake to replace losses. Clear liquids only for 24 hours (water, tea, sport drinks, clear flat ginger ale or cola and juices, broth, jello, popsicles, ect). Advance to bland foods, applesauce, rice, baked or boiled chicken, ect. Avoid milk, greasy foods and anything that doesn't agree with you.  If you experience new or worsening symptoms return or go to ER such as fever, chills, nausea, vomiting, diarrhea, bloody or dark  tarry stools, constipation, urinary symptoms, worsening abdominal discomfort, symptoms that do not improve with medications, inability to keep fluids down, etc...  Reviewed expectations re: course of current medical issues. Questions answered. Outlined signs and symptoms indicating need for more acute intervention. Patient verbalized understanding. After Visit Summary given.   Rennis Harding, PA-C 08/22/18 1135

## 2018-08-22 NOTE — Discharge Instructions (Addendum)
X-ray did not show bowel obstruction.  Symptoms most likely related to viral illness.   Get rest and drink fluids Zofran prescribed.  Take as directed.    DIET Instructions:  30 minutes after taking nausea medicine, begin with sips of clear liquids. If able to hold down 2 - 4 ounces for 30 minutes, begin drinking more. Increase your fluid intake to replace losses. Clear liquids only for 24 hours (water, tea, sport drinks, clear flat ginger ale or cola and juices, broth, jello, popsicles, ect). Advance to bland foods, applesauce, rice, baked or boiled chicken, ect. Avoid milk, greasy foods and anything that doesnt agree with you.  If you experience new or worsening symptoms return or go to ER such as fever, chills, nausea, vomiting, diarrhea, bloody or dark tarry stools, constipation, urinary symptoms, worsening abdominal discomfort, symptoms that do not improve with medications, inability to keep fluids down, etc...Marland Kitchen

## 2020-08-30 IMAGING — DX DG ABDOMEN 2V
2 series · 2 of 2 positions shown · non-contrast
Comparison: None.

CLINICAL DATA: Abdominal pain

EXAM:
ABDOMEN - 2 VIEW

[abdomen erect]
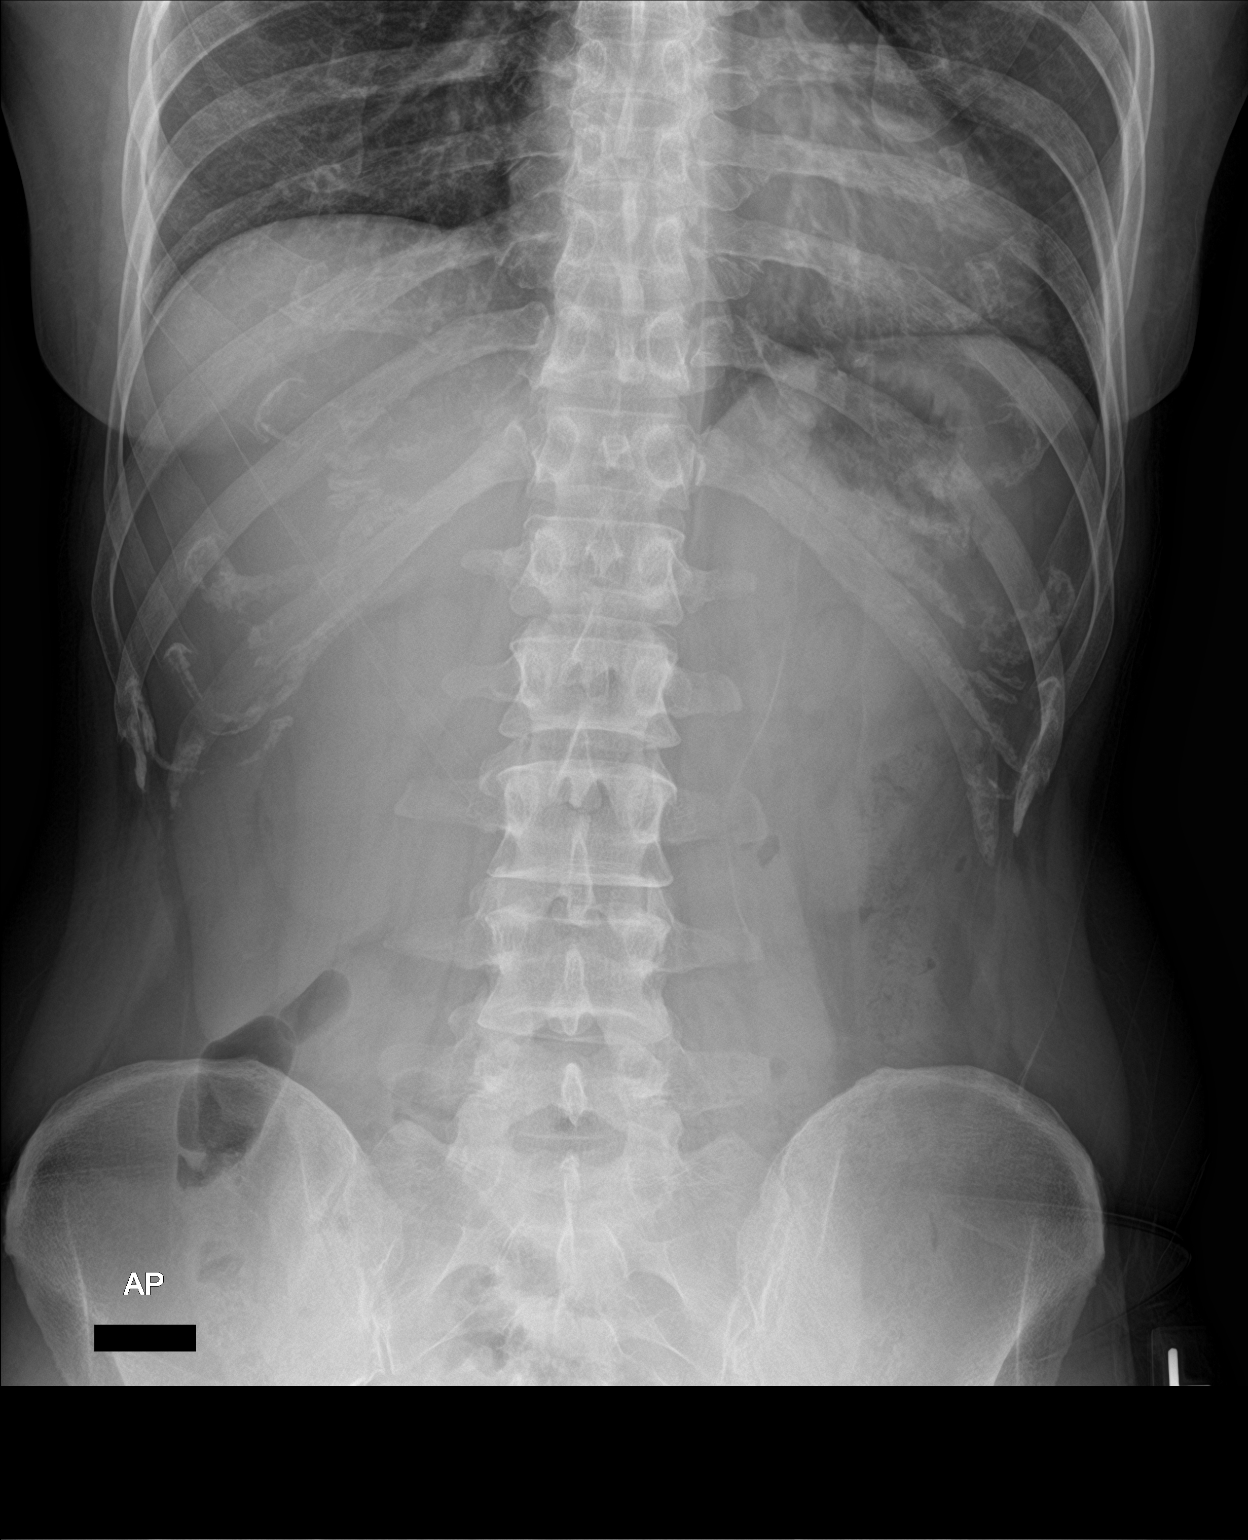

[abdomen supine]
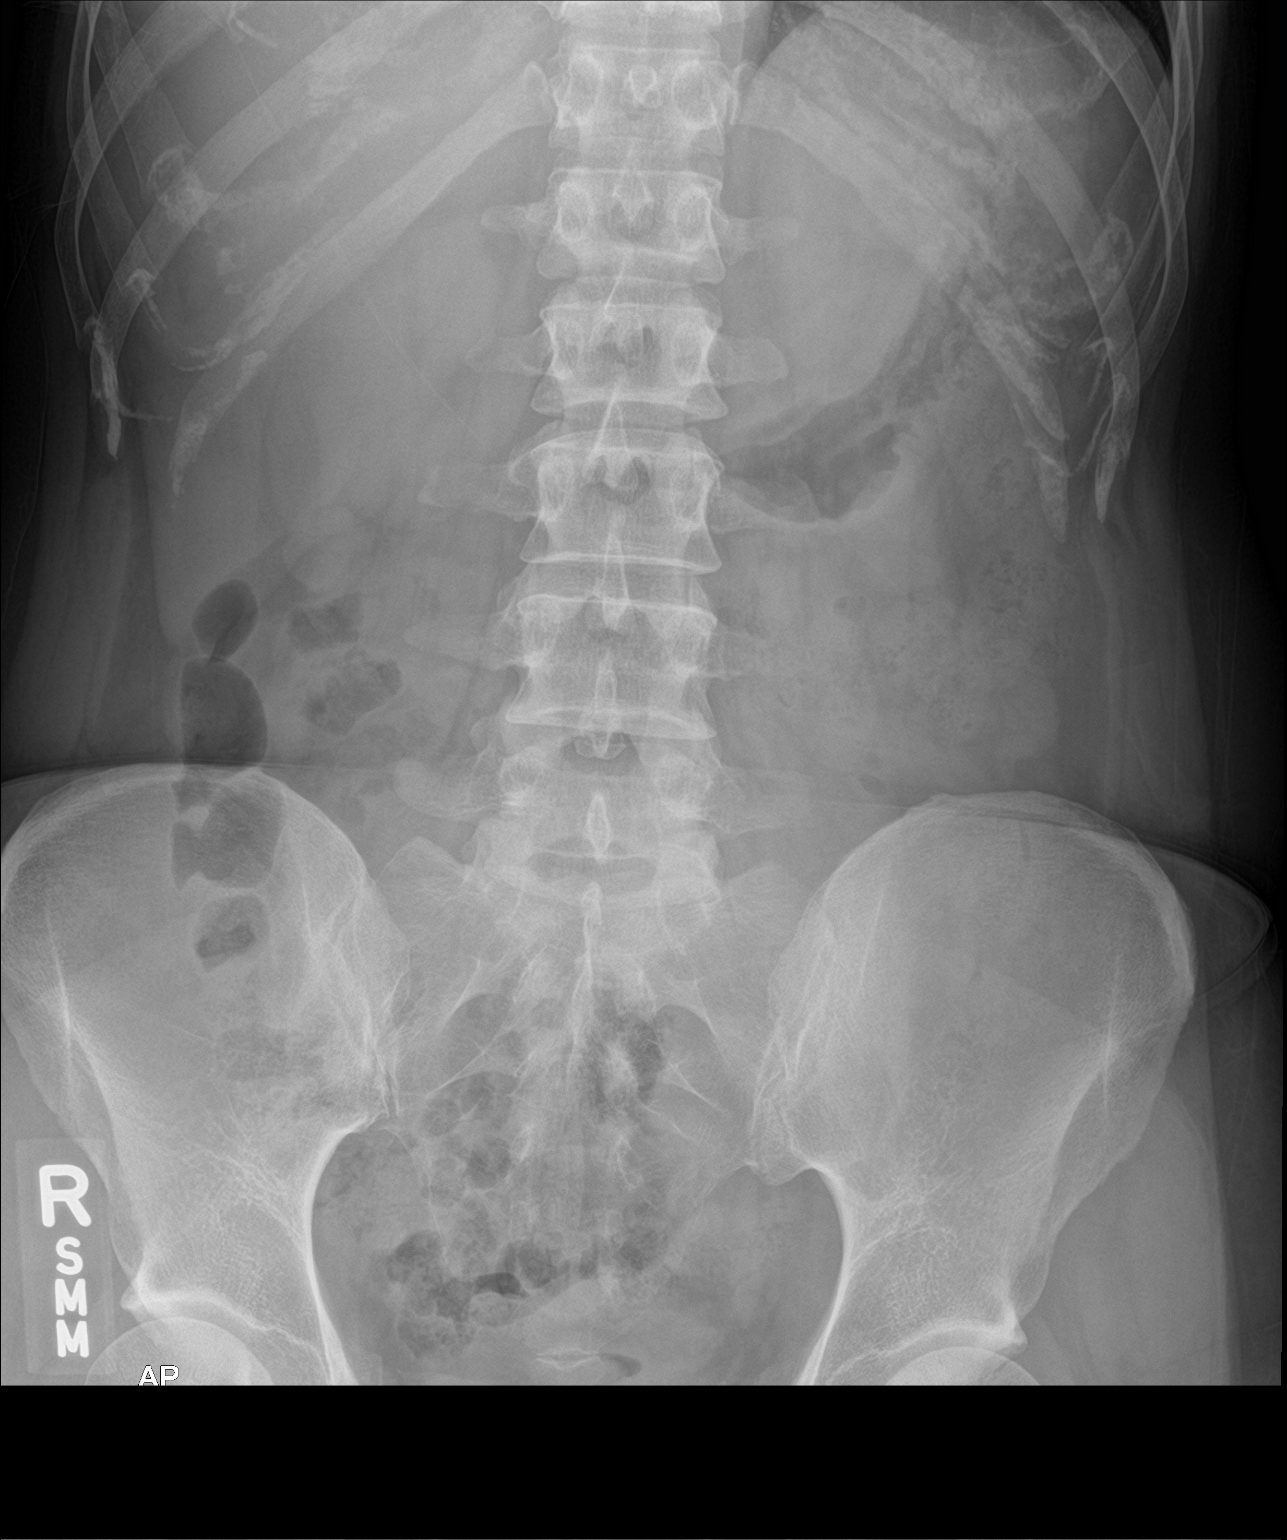

[2 of 2 positions shown; findings below may reference images not displayed]

FINDINGS: There is normal bowel gas pattern. No free air. No organomegaly or
suspicious calcification. No acute bony abnormality.
IMPRESSION: Negative.
